# Patient Record
Sex: Female | Born: 1955 | Race: White | Hispanic: No | Marital: Married | Smoking: Never smoker
Health system: Southern US, Community
[De-identification: ages and names within clinical notes are randomized; demographics above are authoritative.]

## PROBLEM LIST (undated history)

## (undated) DIAGNOSIS — E119 Type 2 diabetes mellitus without complications: Secondary | ICD-10-CM

## (undated) HISTORY — DX: Type 2 diabetes mellitus without complications: E11.9

---

## 2020-10-28 ENCOUNTER — Inpatient Hospital Stay
Admission: EM | Admit: 2020-10-28 | Discharge: 2020-11-02 | DRG: 853 | Disposition: A | Discharge status: Home Health | Payer: Medicare Other | Attending: Family Medicine | Admitting: Family Medicine

## 2020-10-28 ENCOUNTER — Encounter: Payer: Self-pay | Admitting: Emergency Medicine

## 2020-10-28 ENCOUNTER — Emergency Department: Payer: Medicare Other

## 2020-10-28 ENCOUNTER — Other Ambulatory Visit: Payer: Self-pay

## 2020-10-28 DIAGNOSIS — E43 Unspecified severe protein-calorie malnutrition: Secondary | ICD-10-CM | POA: Diagnosis not present

## 2020-10-28 DIAGNOSIS — Z833 Family history of diabetes mellitus: Secondary | ICD-10-CM

## 2020-10-28 DIAGNOSIS — R0602 Shortness of breath: Secondary | ICD-10-CM | POA: Diagnosis not present

## 2020-10-28 DIAGNOSIS — R1110 Vomiting, unspecified: Secondary | ICD-10-CM | POA: Diagnosis not present

## 2020-10-28 DIAGNOSIS — N179 Acute kidney failure, unspecified: Secondary | ICD-10-CM | POA: Diagnosis not present

## 2020-10-28 DIAGNOSIS — C541 Malignant neoplasm of endometrium: Secondary | ICD-10-CM | POA: Diagnosis not present

## 2020-10-28 DIAGNOSIS — N950 Postmenopausal bleeding: Secondary | ICD-10-CM | POA: Diagnosis not present

## 2020-10-28 DIAGNOSIS — C539 Malignant neoplasm of cervix uteri, unspecified: Secondary | ICD-10-CM | POA: Diagnosis not present

## 2020-10-28 DIAGNOSIS — N390 Urinary tract infection, site not specified: Secondary | ICD-10-CM | POA: Diagnosis not present

## 2020-10-28 DIAGNOSIS — A419 Sepsis, unspecified organism: Secondary | ICD-10-CM

## 2020-10-28 DIAGNOSIS — Z9049 Acquired absence of other specified parts of digestive tract: Secondary | ICD-10-CM | POA: Diagnosis not present

## 2020-10-28 DIAGNOSIS — N939 Abnormal uterine and vaginal bleeding, unspecified: Secondary | ICD-10-CM

## 2020-10-28 DIAGNOSIS — D72829 Elevated white blood cell count, unspecified: Secondary | ICD-10-CM | POA: Diagnosis not present

## 2020-10-28 DIAGNOSIS — R6520 Severe sepsis without septic shock: Secondary | ICD-10-CM | POA: Diagnosis not present

## 2020-10-28 DIAGNOSIS — Z20822 Contact with and (suspected) exposure to covid-19: Secondary | ICD-10-CM | POA: Diagnosis present

## 2020-10-28 DIAGNOSIS — B955 Unspecified streptococcus as the cause of diseases classified elsewhere: Secondary | ICD-10-CM | POA: Diagnosis not present

## 2020-10-28 DIAGNOSIS — R000 Tachycardia, unspecified: Secondary | ICD-10-CM | POA: Diagnosis not present

## 2020-10-28 DIAGNOSIS — Z800 Family history of malignant neoplasm of digestive organs: Secondary | ICD-10-CM | POA: Diagnosis not present

## 2020-10-28 DIAGNOSIS — R17 Unspecified jaundice: Secondary | ICD-10-CM | POA: Diagnosis not present

## 2020-10-28 DIAGNOSIS — Z681 Body mass index (BMI) 19 or less, adult: Secondary | ICD-10-CM

## 2020-10-28 DIAGNOSIS — C55 Malignant neoplasm of uterus, part unspecified: Secondary | ICD-10-CM | POA: Diagnosis not present

## 2020-10-28 DIAGNOSIS — A409 Streptococcal sepsis, unspecified: Principal | ICD-10-CM | POA: Diagnosis present

## 2020-10-28 DIAGNOSIS — E1110 Type 2 diabetes mellitus with ketoacidosis without coma: Secondary | ICD-10-CM

## 2020-10-28 DIAGNOSIS — E1165 Type 2 diabetes mellitus with hyperglycemia: Secondary | ICD-10-CM | POA: Diagnosis not present

## 2020-10-28 DIAGNOSIS — E860 Dehydration: Secondary | ICD-10-CM | POA: Diagnosis not present

## 2020-10-28 DIAGNOSIS — R531 Weakness: Secondary | ICD-10-CM | POA: Diagnosis not present

## 2020-10-28 DIAGNOSIS — R7881 Bacteremia: Secondary | ICD-10-CM | POA: Diagnosis not present

## 2020-10-28 DIAGNOSIS — N858 Other specified noninflammatory disorders of uterus: Secondary | ICD-10-CM | POA: Diagnosis not present

## 2020-10-28 DIAGNOSIS — N938 Other specified abnormal uterine and vaginal bleeding: Secondary | ICD-10-CM | POA: Diagnosis not present

## 2020-10-28 DIAGNOSIS — N852 Hypertrophy of uterus: Secondary | ICD-10-CM | POA: Diagnosis not present

## 2020-10-28 LAB — BLOOD GAS, VENOUS
Acid-base deficit: 11.8 mmol/L — ABNORMAL HIGH (ref 0.0–2.0)
Bicarbonate: 13.8 mmol/L — ABNORMAL LOW (ref 20.0–28.0)
O2 Saturation: 57.4 %
Patient temperature: 37
pCO2, Ven: 30 mmHg — ABNORMAL LOW (ref 44.0–60.0)
pH, Ven: 7.27 (ref 7.250–7.430)
pO2, Ven: 35 mmHg (ref 32.0–45.0)

## 2020-10-28 LAB — URINALYSIS, COMPLETE (UACMP) WITH MICROSCOPIC
Bilirubin Urine: NEGATIVE
Glucose, UA: >=500 mg/dL — AB
Ketones, ur: 80 mg/dL — AB
Nitrite: NEGATIVE
Protein, ur: 100 mg/dL — AB
RBC / HPF: >50 RBC/hpf — ABNORMAL HIGH (ref 0–5)
Specific Gravity, Urine: 1.021 (ref 1.005–1.030)
pH: 5 (ref 5.0–8.0)

## 2020-10-28 LAB — CBC WITH DIFFERENTIAL/PLATELET
Abs Immature Granulocytes: 1.15 10*3/uL — ABNORMAL HIGH (ref 0.00–0.07)
Basophils Absolute: 0 10*3/uL (ref 0.0–0.1)
Basophils Relative: 0 %
Eosinophils Absolute: 0 10*3/uL (ref 0.0–0.5)
Eosinophils Relative: 0 %
HCT: 40.6 % (ref 36.0–46.0)
Hemoglobin: 12.6 g/dL (ref 12.0–15.0)
Immature Granulocytes: 3 %
Lymphocytes Relative: 2 %
Lymphs Abs: 0.9 10*3/uL (ref 0.7–4.0)
MCH: 25.1 pg — ABNORMAL LOW (ref 26.0–34.0)
MCHC: 31 g/dL (ref 30.0–36.0)
MCV: 81 fL (ref 80.0–100.0)
Monocytes Absolute: 1 10*3/uL (ref 0.1–1.0)
Monocytes Relative: 2 %
Neutro Abs: 36.7 10*3/uL — ABNORMAL HIGH (ref 1.7–7.7)
Neutrophils Relative %: 93 %
Platelets: 544 10*3/uL — ABNORMAL HIGH (ref 150–400)
RBC: 5.01 MIL/uL (ref 3.87–5.11)
RDW: 15 % (ref 11.5–15.5)
Smear Review: NORMAL
WBC: 39.8 10*3/uL — ABNORMAL HIGH (ref 4.0–10.5)
nRBC: 0 % (ref 0.0–0.2)

## 2020-10-28 LAB — BETA-HYDROXYBUTYRIC ACID: Beta-Hydroxybutyric Acid: >8 mmol/L — ABNORMAL HIGH (ref 0.05–0.27)

## 2020-10-28 LAB — CBG MONITORING, ED
Glucose-Capillary: 592 mg/dL (ref 70–99)
Glucose-Capillary: 522 mg/dL (ref 70–99)
Glucose-Capillary: 441 mg/dL — ABNORMAL HIGH (ref 70–99)
Glucose-Capillary: 452 mg/dL — ABNORMAL HIGH (ref 70–99)
Glucose-Capillary: 338 mg/dL — ABNORMAL HIGH (ref 70–99)

## 2020-10-28 LAB — COMPREHENSIVE METABOLIC PANEL
ALT: 14 U/L (ref 0–44)
AST: 16 U/L (ref 15–41)
Albumin: 2.7 g/dL — ABNORMAL LOW (ref 3.5–5.0)
Alkaline Phosphatase: 217 U/L — ABNORMAL HIGH (ref 38–126)
Anion gap: 38 — ABNORMAL HIGH (ref 5–15)
BUN: 28 mg/dL — ABNORMAL HIGH (ref 8–23)
CO2: 13 mmol/L — ABNORMAL LOW (ref 22–32)
Calcium: 9.7 mg/dL (ref 8.9–10.3)
Chloride: 87 mmol/L — ABNORMAL LOW (ref 98–111)
Creatinine, Ser: 1.11 mg/dL — ABNORMAL HIGH (ref 0.44–1.00)
GFR, Estimated: 55 mL/min — ABNORMAL LOW (ref >60)
Glucose, Bld: 624 mg/dL (ref 70–99)
Potassium: 3.6 mmol/L (ref 3.5–5.1)
Sodium: 138 mmol/L (ref 135–145)
Total Bilirubin: 3.2 mg/dL — ABNORMAL HIGH (ref 0.3–1.2)
Total Protein: 6.9 g/dL (ref 6.5–8.1)

## 2020-10-28 LAB — LACTIC ACID, PLASMA
Lactic Acid, Venous: 2.2 mmol/L (ref 0.5–1.9)
Lactic Acid, Venous: 2.3 mmol/L (ref 0.5–1.9)

## 2020-10-28 LAB — TROPONIN I (HIGH SENSITIVITY)
Troponin I (High Sensitivity): 50 ng/L — ABNORMAL HIGH (ref <18)
Troponin I (High Sensitivity): 19 ng/L — ABNORMAL HIGH (ref <18)

## 2020-10-28 LAB — SARS CORONAVIRUS 2 BY RT PCR (HOSPITAL ORDER, PERFORMED IN ~~LOC~~ HOSPITAL LAB): SARS Coronavirus 2: NEGATIVE

## 2020-10-28 IMAGING — CT CT ABD-PELV W/ CM
2 of 5 series · 15 of 46 positions shown, 17 images · IV contrast (APPLIED)
Comparison: None.

CLINICAL DATA: Nausea and vomiting with weight loss for several
months

EXAM:
CT ABDOMEN AND PELVIS WITH CONTRAST
TECHNIQUE: Multidetector CT imaging of the abdomen and pelvis was performed
using the standard protocol following bolus administration of
intravenous contrast.
CONTRAST:  75mL OMNIPAQUE IOHEXOL 300 MG/ML  SOLN

[Series 2: routine abd/pel with · axial · 0.75mm/px · z∈[-1480,-1060]mm · 12 of 96 slices shown, 14 images]
[im 6/96  soft-tissue]
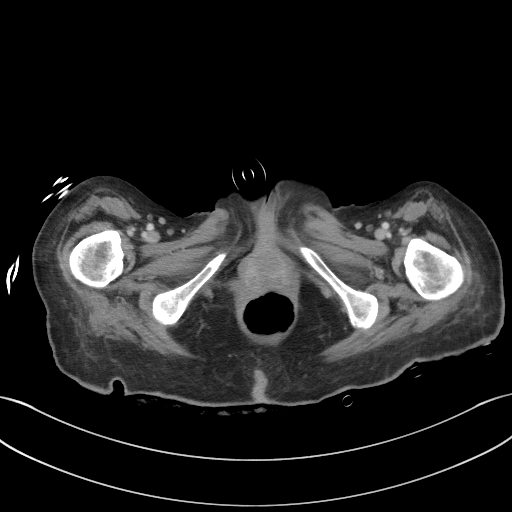
[im 6/96  bone]
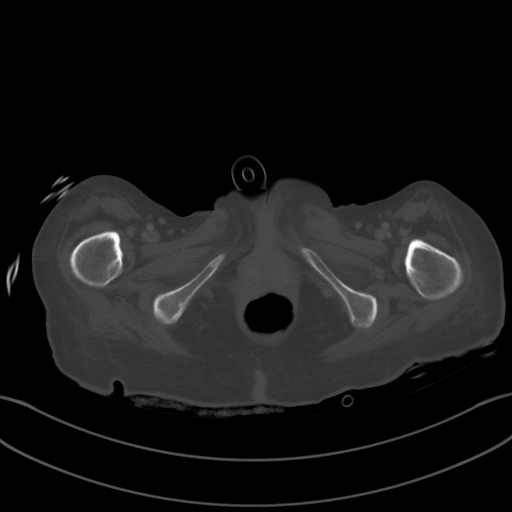
[im 16/96  soft-tissue]
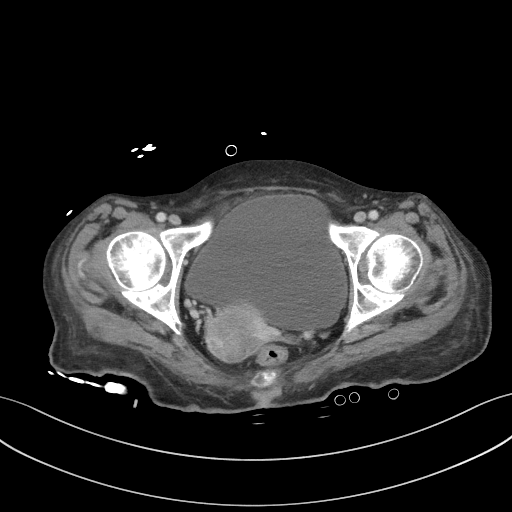
[im 22/96  soft-tissue]
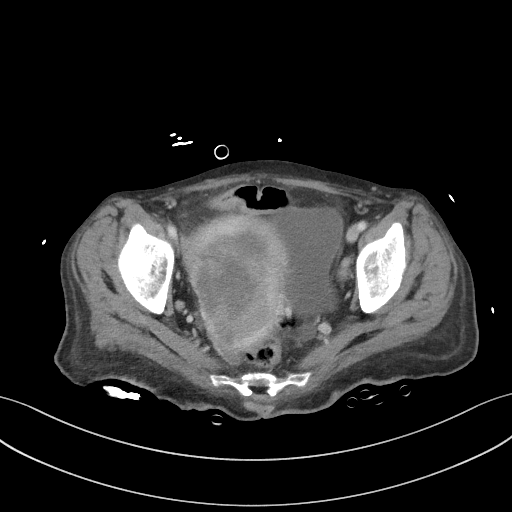
[im 27/96  soft-tissue]
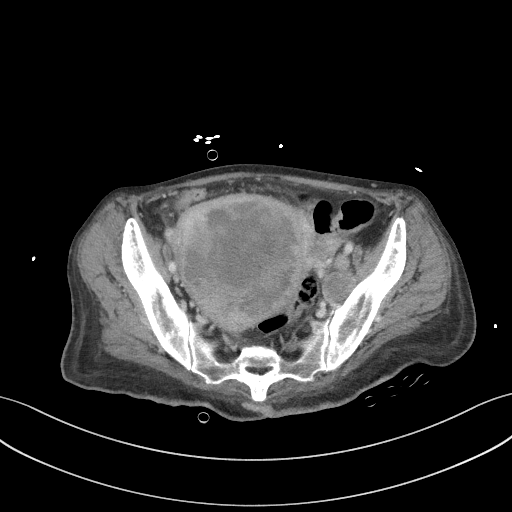
[im 37/96  soft-tissue]
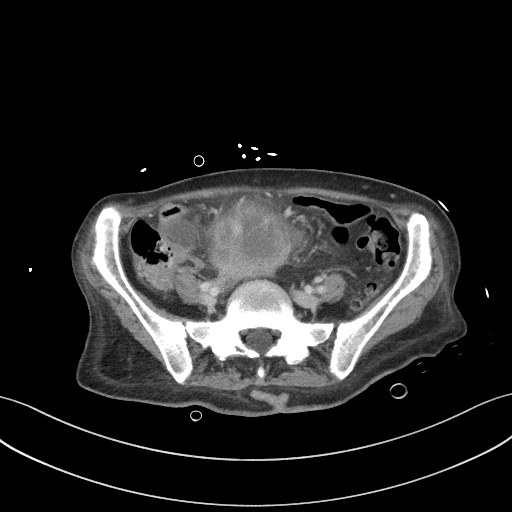
[im 43/96  soft-tissue]
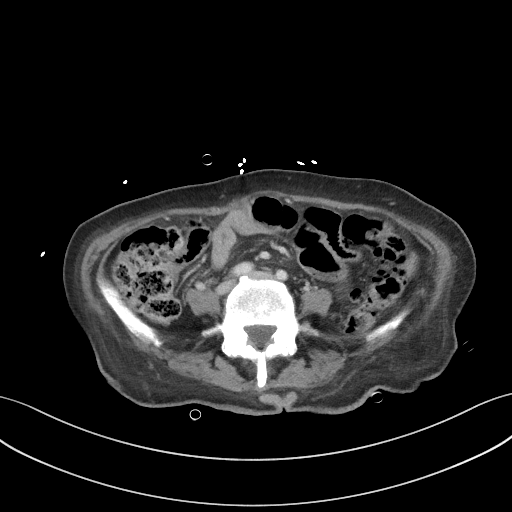
[im 53/96  soft-tissue]
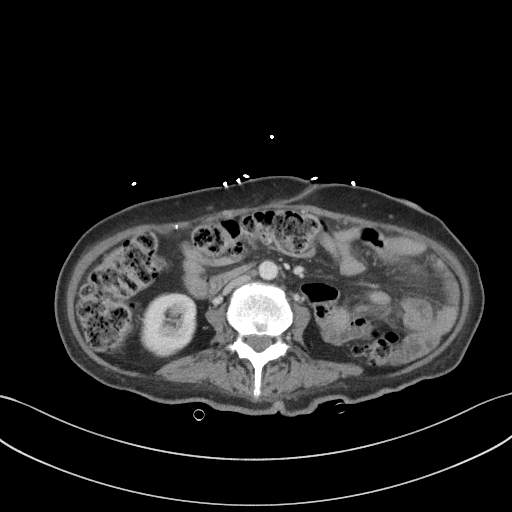
[im 59/96  soft-tissue]
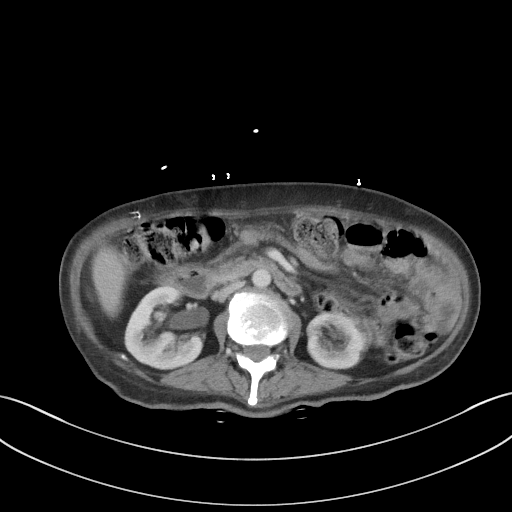
[im 69/96  soft-tissue]
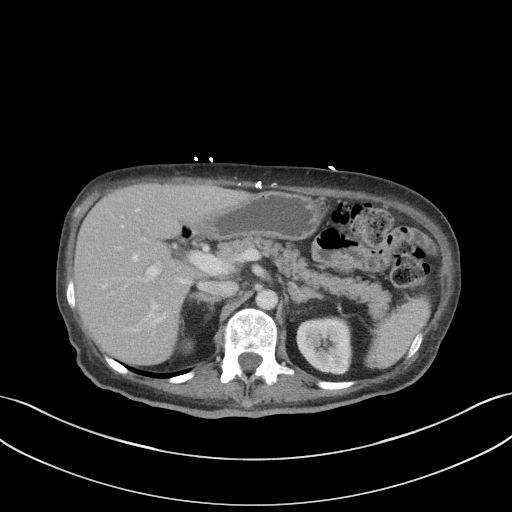
[im 69/96  bone]
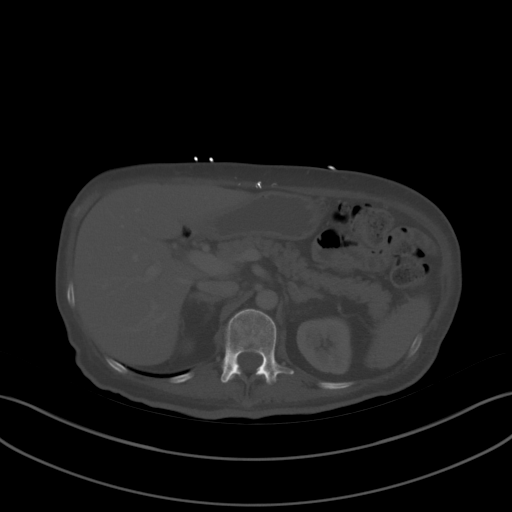
[im 74/96  soft-tissue]
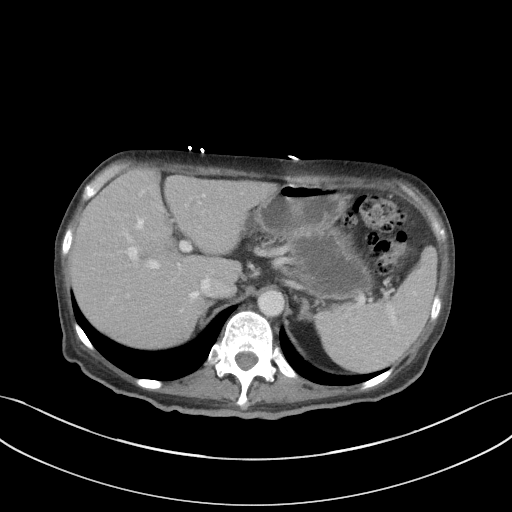
[im 80/96  soft-tissue]
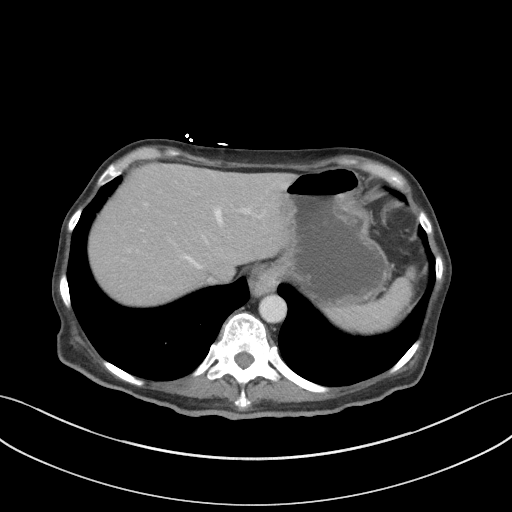
[im 90/96  soft-tissue]
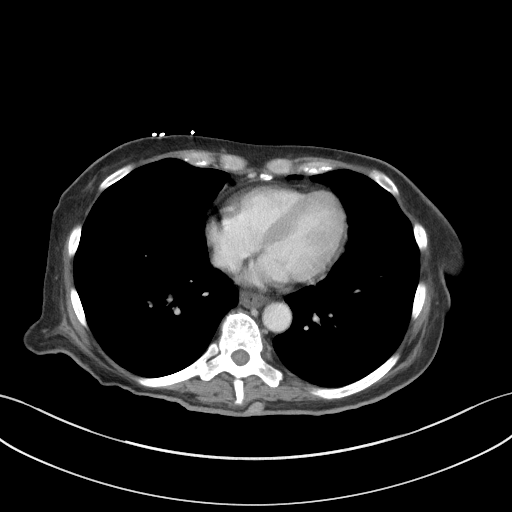

[Series 5: coronal st · coronal · 0.76mm/px · 3 of 66 slices shown]
[im 22/66  soft-tissue]
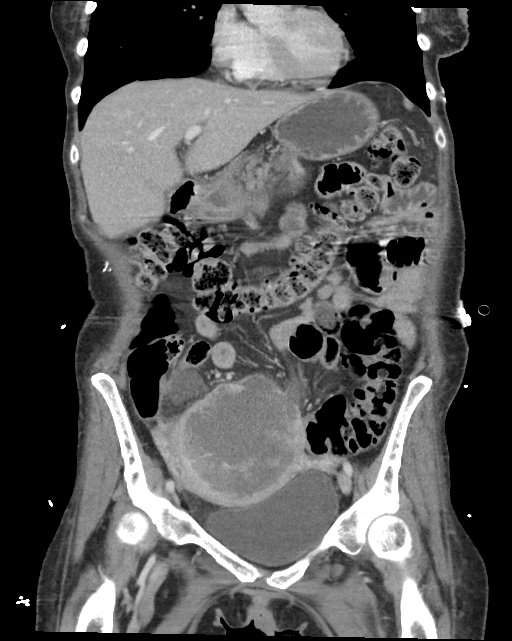
[im 29/66  soft-tissue]
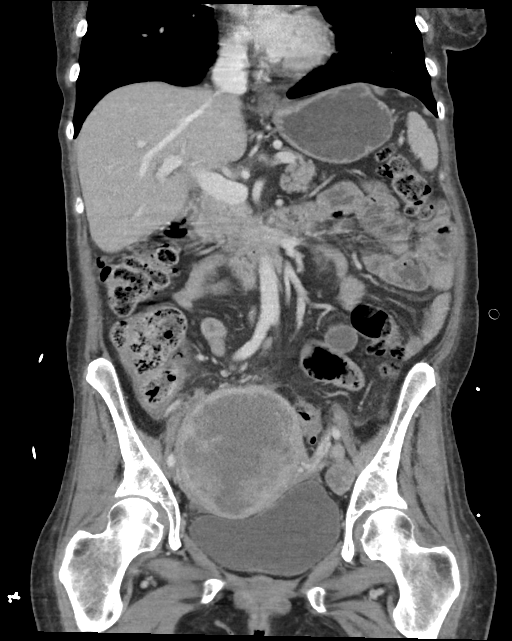
[im 37/66  soft-tissue]
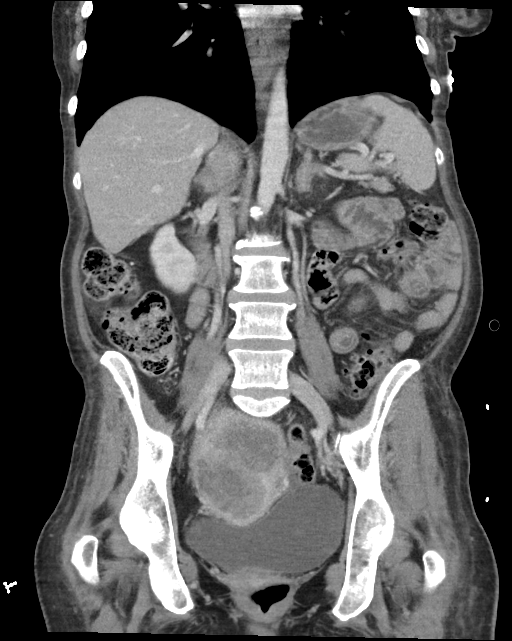

[15 of 46 positions shown; findings below may reference images not displayed]

FINDINGS: Lower chest: No acute abnormality.

Hepatobiliary: Liver is well visualized and within normal limits.
Gallbladder is not well seen and may have been surgically removed.
Clinical correlation is recommended.

Pancreas: Unremarkable. No pancreatic ductal dilatation or
surrounding inflammatory changes.

Spleen: Normal in size without focal abnormality.

Adrenals/Urinary Tract: Adrenal glands are within normal limits.
Kidneys demonstrate a normal enhancement pattern bilaterally.
Fullness of the right renal collecting system is noted secondary to
extrinsic compression by the prominent uterus. Bladder is somewhat
distorted secondary to the enlarged uterus but is otherwise well
distended.

Stomach/Bowel: Mild diverticular change of the colon is noted
without evidence of diverticulitis. The appendix is within normal
limits. No small bowel abnormality is seen. Small sliding-type
hiatal hernia is noted.

Vascular/Lymphatic: Abdominal aorta shows no aneurysmal dilatation.
Prominent centrally necrotic left iliac lymph node is noted
measuring 2.2 cm in short axis. No other sizable adenopathy is
noted.

Reproductive: The uterus is significantly enlarged measuring 8.7 by
9.9 cm in greatest AP and transverse dimensions respectively. It is
elongated and diffusely decreased in attenuation with peripheral
enhancement highly suggestive of endometrial neoplasm. These changes
extend into the cervical canal and may extend into the vaginal vault
on the sagittal images. No definitive adnexal mass is noted.

Other: No free fluid is seen. No definitive endometrial deposits are
noted at this time. Postsurgical changes in the anterior abdominal
wall are seen.

Musculoskeletal: No acute or significant osseous findings.
IMPRESSION: Diffuse enlargement of the uterus with peripheral enhancement and
central decreased attenuation/necrosis consistent with endometrial
neoplasm till proven otherwise. This extends into the cervical canal
and likely into the vaginal vault. Associated left iliac adenopathy
is noted of a similar appearance with central necrosis. Given its
diffuse distribution throughout the entire uterus, this is not felt
to represent fibroid change. Ultrasound is not likely to be helpful
as a diagnostic tool. Direct visualization is recommended. Tissue
sampling is also recommended. Surgical consultation is also
recommended.

Diverticulosis without diverticulitis

Prominence of the right renal collecting system and proximal right
ureter secondary to extrinsic compression by the enlarged uterus. No
true obstructing lesion is noted within the ureter.

## 2020-10-28 MED ORDER — LACTATED RINGERS IV SOLN: INTRAVENOUS | Status: DC

## 2020-10-28 MED ORDER — DEXTROSE IN LACTATED RINGERS 5 % IV SOLN: INTRAVENOUS | Status: DC

## 2020-10-28 MED ORDER — PHENOL 1.4 % MT LIQD
1.0000 | OROMUCOSAL | Status: DC | PRN
  Filled 2020-10-28 (×2): qty 177

## 2020-10-28 MED ORDER — SODIUM CHLORIDE 0.9 % IV SOLN
2.0000 g | Freq: Once | INTRAVENOUS | Status: AC
  Administered 2020-10-28: 2 g via INTRAVENOUS
  Filled 2020-10-28: qty 2

## 2020-10-28 MED ORDER — LORAZEPAM 2 MG/ML IJ SOLN
0.5000 mg | Freq: Once | INTRAMUSCULAR | Status: AC
  Administered 2020-10-28: 0.5 mg via INTRAVENOUS
  Filled 2020-10-28: qty 1

## 2020-10-28 MED ORDER — INSULIN REGULAR(HUMAN) IN NACL 100-0.9 UT/100ML-% IV SOLN
INTRAVENOUS | Status: DC
  Administered 2020-10-28: 8.5 [IU]/h via INTRAVENOUS
  Filled 2020-10-28: qty 100

## 2020-10-28 MED ORDER — SODIUM CHLORIDE 0.9 % IV BOLUS
500.0000 mL | Freq: Once | INTRAVENOUS | Status: AC
  Administered 2020-10-29: 500 mL via INTRAVENOUS

## 2020-10-28 MED ORDER — LACTATED RINGERS IV BOLUS
1000.0000 mL | Freq: Once | INTRAVENOUS | Status: AC
  Administered 2020-10-28: 1000 mL via INTRAVENOUS

## 2020-10-28 MED ORDER — METRONIDAZOLE IN NACL 5-0.79 MG/ML-% IV SOLN
500.0000 mg | Freq: Once | INTRAVENOUS | Status: AC
  Administered 2020-10-28: 500 mg via INTRAVENOUS
  Filled 2020-10-28: qty 100

## 2020-10-28 MED ORDER — POTASSIUM CHLORIDE 10 MEQ/100ML IV SOLN
10.0000 meq | INTRAVENOUS | Status: AC
  Administered 2020-10-28 (×2): 10 meq via INTRAVENOUS
  Filled 2020-10-28 (×2): qty 100

## 2020-10-28 MED ORDER — DEXTROSE 50 % IV SOLN: 0.0000 mL | INTRAVENOUS | Status: DC | PRN

## 2020-10-28 MED ORDER — IOHEXOL 300 MG/ML  SOLN
75.0000 mL | Freq: Once | INTRAMUSCULAR | Status: AC | PRN
  Administered 2020-10-28: 75 mL via INTRAVENOUS

## 2020-10-28 MED ORDER — ONDANSETRON HCL 4 MG/2ML IJ SOLN
4.0000 mg | Freq: Once | INTRAMUSCULAR | Status: AC
  Administered 2020-10-28: 4 mg via INTRAVENOUS
  Filled 2020-10-28: qty 2

## 2020-10-28 NOTE — ED Notes (Addendum)
This RN attempted to obtain second set of blood cultures x 2. RN Lorriane Shire attempted x 2. Unsuccessful. ABX started. Lab called to collect 2nd set

## 2020-10-28 NOTE — ED Provider Notes (Signed)
[facility] Emergency Department Provider Note  ____________________________________________   Event Date/Time   First MD Initiated Contact with Patient 10/28/20 1748     (approximate)  I have reviewed the triage vital signs and the nursing notes.   HISTORY  Chief Complaint Hyperglycemia    HPI Mindy Olson is a 65 y.o. female  Here with nausea, vomiting, polyuria, polydipsia. Pt reports that for the past 3-4 weeks, she's had ongoing and worsening nausea, vomiting, and indigestion. She's been trying to drink juices to stay hydrated and has subsequently been having more indigestion/heart burn.  She states that over the last week, she has had extreme thirst, despite drinking significant amounts of water, as well as fatigue and some occasional shortness of breath.  She has had intermittent abdominal cramping though this has not been constant.  She has had associated increased urination although this is somewhat decreased now.  She states she feels intermittent indigestion, but this is also not constant.  No other complaints.  No fevers or chills.  No other medications.        History reviewed. No pertinent past medical history.  Patient Active Problem List   Diagnosis Date Noted  . Sepsis secondary to UTI ([place]) 10/29/2020  . AKI (acute kidney injury) ([place]) 10/29/2020  . Abnormal uterine and vaginal bleeding, unspecified 10/29/2020  . Elevated bilirubin 10/29/2020  . DKA (diabetic ketoacidosis) ([place]) 10/28/2020    History reviewed. No pertinent surgical history.  Prior to Admission medications   Not on File    Allergies Patient has no known allergies.  No family history on file.  Social History Social History   Tobacco Use  . Smoking status: Never Smoker  . Smokeless tobacco: Never Used  Substance Use Topics  . Alcohol use: Not Currently  . Drug use: Not Currently    Review of Systems  Review of Systems  Constitutional: Positive  for fatigue. Negative for chills and fever.  HENT: Negative for congestion and sore throat.   Eyes: Negative for visual disturbance.  Respiratory: Negative for cough and shortness of breath.   Cardiovascular: Negative for chest pain.  Gastrointestinal: Negative for abdominal pain, diarrhea, nausea and vomiting.  Endocrine: Positive for polydipsia, polyphagia and polyuria.  Genitourinary: Negative for flank pain.  Musculoskeletal: Negative for back pain and neck pain.  Skin: Negative for rash and wound.  Allergic/Immunologic: Negative for immunocompromised state.  Neurological: Positive for weakness. Negative for numbness.  Hematological: Does not bruise/bleed easily.  All other systems reviewed and are negative.    ____________________________________________  PHYSICAL EXAM:      VITAL SIGNS: ED Triage Vitals  Enc Vitals Group     BP 10/28/20 1732 97/64     Pulse Rate 10/28/20 1732 (!) 144     Resp 10/28/20 1732 20     Temp 10/28/20 1732 97.7 F (36.5 C)     Temp Source 10/28/20 1732 Oral     SpO2 10/28/20 1732 100 %     Weight 10/28/20 1733 112 lb (50.8 kg)     Height 10/28/20 1733 5\' 4"  (1.626 m)     Head Circumference --      Peak Flow --      Pain Score 10/28/20 1733 0     Pain Loc --      Pain Edu? --      Excl. in [place]? --      Physical Exam Vitals and nursing note reviewed.  Constitutional:  General: She is not in acute distress.    Appearance: She is well-developed and well-nourished.  HENT:     Head: Normocephalic and atraumatic.     Mouth/Throat:     Mouth: Mucous membranes are dry.  Eyes:     Conjunctiva/sclera: Conjunctivae normal.  Cardiovascular:     Rate and Rhythm: Regular rhythm. Tachycardia present.     Heart sounds: Normal heart sounds.  Pulmonary:     Effort: Pulmonary effort is normal. No respiratory distress.     Breath sounds: No wheezing.  Abdominal:     General: Abdomen is flat. There is no distension.     Tenderness: There is no  abdominal tenderness.  Musculoskeletal:        General: No edema.     Cervical back: Neck supple.  Skin:    General: Skin is warm.     Capillary Refill: Capillary refill takes less than 2 seconds.     Findings: No rash.  Neurological:     Mental Status: She is alert and oriented to person, place, and time.     Motor: No abnormal muscle tone.       ____________________________________________   LABS (all labs ordered are listed, but only abnormal results are displayed)  Labs Reviewed  CBC WITH DIFFERENTIAL/PLATELET - Abnormal; Notable for the following components:      Result Value   WBC 39.8 (*)    MCH 25.1 (*)    Platelets 544 (*)    Neutro Abs 36.7 (*)    Abs Immature Granulocytes 1.15 (*)    All other components within normal limits  COMPREHENSIVE METABOLIC PANEL - Abnormal; Notable for the following components:   Chloride 87 (*)    CO2 13 (*)    Glucose, Bld 624 (*)    BUN 28 (*)    Creatinine, Ser 1.11 (*)    Albumin 2.7 (*)    Alkaline Phosphatase 217 (*)    Total Bilirubin 3.2 (*)    GFR, Estimated 55 (*)    Anion gap 38 (*)    All other components within normal limits  BLOOD GAS, VENOUS - Abnormal; Notable for the following components:   pCO2, Ven 30 (*)    Bicarbonate 13.8 (*)    Acid-base deficit 11.8 (*)    All other components within normal limits  BETA-HYDROXYBUTYRIC ACID - Abnormal; Notable for the following components:   Beta-Hydroxybutyric Acid >8.00 (*)    All other components within normal limits  LACTIC ACID, PLASMA - Abnormal; Notable for the following components:   Lactic Acid, Venous 2.2 (*)    All other components within normal limits  LACTIC ACID, PLASMA - Abnormal; Notable for the following components:   Lactic Acid, Venous 2.3 (*)    All other components within normal limits  URINALYSIS, COMPLETE (UACMP) WITH MICROSCOPIC - Abnormal; Notable for the following components:   Color, Urine AMBER (*)    APPearance CLOUDY (*)    Glucose, UA  >=500 (*)    Hgb urine dipstick LARGE (*)    Ketones, ur 80 (*)    Protein, ur 100 (*)    Leukocytes,Ua MODERATE (*)    RBC / HPF >50 (*)    Bacteria, UA RARE (*)    All other components within normal limits  CBG MONITORING, ED - Abnormal; Notable for the following components:   Glucose-Capillary 592 (*)    All other components within normal limits  CBG MONITORING, ED - Abnormal; Notable for the following  components:   Glucose-Capillary 522 (*)    All other components within normal limits  CBG MONITORING, ED - Abnormal; Notable for the following components:   Glucose-Capillary 452 (*)    All other components within normal limits  CBG MONITORING, ED - Abnormal; Notable for the following components:   Glucose-Capillary 441 (*)    All other components within normal limits  CBG MONITORING, ED - Abnormal; Notable for the following components:   Glucose-Capillary 338 (*)    All other components within normal limits  CBG MONITORING, ED - Abnormal; Notable for the following components:   Glucose-Capillary 271 (*)    All other components within normal limits  CBG MONITORING, ED - Abnormal; Notable for the following components:   Glucose-Capillary 211 (*)    All other components within normal limits  TROPONIN I (HIGH SENSITIVITY) - Abnormal; Notable for the following components:   Troponin I (High Sensitivity) 50 (*)    All other components within normal limits  TROPONIN I (HIGH SENSITIVITY) - Abnormal; Notable for the following components:   Troponin I (High Sensitivity) 19 (*)    All other components within normal limits  SARS CORONAVIRUS 2 BY RT PCR (HOSPITAL ORDER, [place] [facility])  CULTURE, BLOOD (SINGLE)  CULTURE, BLOOD (SINGLE)  BASIC METABOLIC PANEL  BASIC METABOLIC PANEL  HIV ANTIBODY (ROUTINE TESTING W REFLEX)  BASIC METABOLIC PANEL  BASIC METABOLIC PANEL  BASIC METABOLIC PANEL  BASIC METABOLIC PANEL  BETA-HYDROXYBUTYRIC ACID  BETA-HYDROXYBUTYRIC  ACID  HEMOGLOBIN Q0H  BASIC METABOLIC PANEL  BASIC METABOLIC PANEL  BETA-HYDROXYBUTYRIC ACID  LACTIC ACID, PLASMA  LACTIC ACID, PLASMA    ____________________________________________  EKG: Sinus tachycardia, ventricular rate 149.  PR 120, QRS 72, QTc 532.  No acute ST elevations or depressions.  No KG evidence of acute ischemia or infarct. ________________________________________  RADIOLOGY All imaging, including plain films, CT scans, and ultrasounds, independently reviewed by me, and interpretations confirmed via formal radiology reads.  ED MD interpretation:   CT A/P: Large uterine mass with possible LN involvement  Official radiology report(s): CT ABDOMEN PELVIS W CONTRAST  Result Date: 10/28/2020 CLINICAL DATA:  Nausea and vomiting with weight loss for several months EXAM: CT ABDOMEN AND PELVIS WITH CONTRAST TECHNIQUE: Multidetector CT imaging of the abdomen and pelvis was performed using the standard protocol following bolus administration of intravenous contrast. CONTRAST:  24mL OMNIPAQUE IOHEXOL 300 MG/ML  SOLN COMPARISON:  None. FINDINGS: Lower chest: No acute abnormality. Hepatobiliary: Liver is well visualized and within normal limits. Gallbladder is not well seen and may have been surgically removed. Clinical correlation is recommended. Pancreas: Unremarkable. No pancreatic ductal dilatation or surrounding inflammatory changes. Spleen: Normal in size without focal abnormality. Adrenals/Urinary Tract: Adrenal glands are within normal limits. Kidneys demonstrate a normal enhancement pattern bilaterally. Fullness of the right renal collecting system is noted secondary to extrinsic compression by the prominent uterus. Bladder is somewhat distorted secondary to the enlarged uterus but is otherwise well distended. Stomach/Bowel: Mild diverticular change of the colon is noted without evidence of diverticulitis. The appendix is within normal limits. No small bowel abnormality is seen.  Small sliding-type hiatal hernia is noted. Vascular/Lymphatic: Abdominal aorta shows no aneurysmal dilatation. Prominent centrally necrotic left iliac lymph node is noted measuring 2.2 cm in short axis. No other sizable adenopathy is noted. Reproductive: The uterus is significantly enlarged measuring 8.7 by 9.9 cm in greatest AP and transverse dimensions respectively. It is elongated and diffusely decreased in attenuation with peripheral enhancement  highly suggestive of endometrial neoplasm. These changes extend into the cervical canal and may extend into the vaginal vault on the sagittal images. No definitive adnexal mass is noted. Other: No free fluid is seen. No definitive endometrial deposits are noted at this time. Postsurgical changes in the anterior abdominal wall are seen. Musculoskeletal: No acute or significant osseous findings. 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 This extends into the cervical canal and likely into the vaginal vault. Associated left iliac adenopathy is noted of a similar appearance with central necrosis. Given its diffuse distribution throughout the entire uterus, this is not felt to represent fibroid change. Ultrasound is not likely to be helpful as a diagnostic tool. Direct visualization is recommended. Tissue sampling is also recommended. Surgical consultation is also recommended. Diverticulosis without diverticulitis Prominence of the right renal collecting system and proximal right ureter secondary to extrinsic compression by the enlarged uterus. No true obstructing lesion is noted within the ureter. Electronically Signed   By: Inez Catalina M.D.   On: 10/28/2020 21:56   DG Chest Portable 1 View  Result Date: 10/28/2020 CLINICAL DATA:  Shortness of breath EXAM: PORTABLE CHEST 1 VIEW COMPARISON:  None. FINDINGS: The heart size and mediastinal  contours are within normal limits. Both lungs are clear. The visualized skeletal structures are unremarkable. IMPRESSION: No active disease. Electronically Signed   By: Inez Catalina M.D.   On: 10/28/2020 19:40    ____________________________________________  PROCEDURES   Procedure(s) performed (including Critical Care):  .Critical Care Performed by: Duffy Bruce, MD Authorized by: Duffy Bruce, MD   Critical care provider statement:    Critical care time (minutes):  35   Critical care time was exclusive of:  Separately billable procedures and treating other patients and teaching time   Critical care was necessary to treat or prevent imminent or life-threatening deterioration of the following conditions:  Cardiac failure, circulatory failure, dehydration, metabolic crisis and sepsis   Critical care was time spent personally by me on the following activities:  Development of treatment plan with patient or surrogate, discussions with consultants, evaluation of patient's response to treatment, examination of patient, obtaining history from patient or surrogate, ordering and performing treatments and interventions, ordering and review of laboratory studies, ordering and review of radiographic studies, pulse oximetry, re-evaluation of patient's condition and review of old charts   I assumed direction of critical care for this patient from another provider in my specialty: no   .1-3 Lead EKG Interpretation Performed by: Duffy Bruce, MD Authorized by: Duffy Bruce, MD     Interpretation: abnormal     ECG rate:  100-130   ECG rate assessment: tachycardic     Rhythm: sinus tachycardia     Ectopy: none     Conduction: normal   Comments:     Indication: dka    ____________________________________________  INITIAL IMPRESSION / MDM / [place] / ED COURSE  As part of my medical decision making, I reviewed the following data within the [place] notes reviewed and incorporated, Old chart reviewed, Notes from prior ED visits, and [place] Controlled Substance Database       *Mindy Olson was evaluated in Emergency Department on 10/29/2020 for the symptoms described in the history of present illness. She was evaluated in the context of the global COVID-19 pandemic, which necessitated consideration that the patient might be at risk for infection with  the SARS-CoV-2 virus that causes COVID-19. Institutional protocols and algorithms that pertain to the evaluation of patients at risk for COVID-19 are in a state of rapid change based on information released by regulatory bodies including the CDC and federal and state organizations. These policies and algorithms were followed during the patient's care in the ED.  Some ED evaluations and interventions may be delayed as a result of limited staffing during the pandemic.*     Medical Decision Making: 65 year old female here with new onset diabetes and DKA.  Suspect this was triggered in the setting of infection.  Labs reviewed, revealed marked leukocytosis with shift, acidosis with bicarb of 13 and anion gap greater than 30, glucose 624.  pH 7.27.  Troponin mildly elevated likely related to demand in EKG shows no ST elevations and she has no chest pain.  Given her marked leukocytosis, tachycardia, and concern for intra-abdominal infection, she was empirically covered with antibiotics and will obtain a CT of the abdomen and pelvis.  Patient started on aggressive fluid resuscitation.  CT reviewed, concerning for endometrial carcinoma, with possible LN involvement. I discussed this with pt, daughter at bedside. No surgical emergency. UA reviewed and shows pyuria, hematuria, raising question of UTI as well.  Pt has been given fluids, insulin, broad-spectrum ABX. Admit to medicine.  ____________________________________________  FINAL CLINICAL IMPRESSION(S) / ED DIAGNOSES  Final diagnoses:   Diabetic ketoacidosis without coma associated with type 2 diabetes mellitus (HCC)  Dehydration  Leukocytosis, unspecified type     MEDICATIONS GIVEN DURING THIS VISIT:  Medications  phenol (CHLORASEPTIC) mouth spray 1 spray (has no administration in time range)  insulin regular, human (MYXREDLIN) 100 units/ 100 mL infusion (3.6 Units/hr Intravenous Rate/Dose Change 10/29/20 0055)  lactated ringers infusion ( Intravenous New Bag/Given 10/28/20 1955)  dextrose 5 % in lactated ringers infusion (has no administration in time range)  dextrose 50 % solution 0-50 mL (has no administration in time range)  cefTRIAXone (ROCEPHIN) 1 g in sodium chloride 0.9 % 100 mL IVPB (has no administration in time range)  ondansetron (ZOFRAN) injection 4 mg (has no administration in time range)  lactated ringers bolus 1,000 mL (0 mLs Intravenous Stopped 10/28/20 2005)  lactated ringers bolus 1,000 mL (0 mLs Intravenous Stopped 10/28/20 2005)  ondansetron (ZOFRAN) injection 4 mg (4 mg Intravenous Given 10/28/20 1912)  potassium chloride 10 mEq in 100 mL IVPB (0 mEq Intravenous Stopped 10/28/20 2145)  ceFEPIme (MAXIPIME) 2 g in sodium chloride 0.9 % 100 mL IVPB (0 g Intravenous Stopped 10/28/20 2102)  metroNIDAZOLE (FLAGYL) IVPB 500 mg (0 mg Intravenous Stopped 10/28/20 2207)  iohexol (OMNIPAQUE) 300 MG/ML solution 75 mL (75 mLs Intravenous Contrast Given 10/28/20 2119)  sodium chloride 0.9 % bolus 500 mL (500 mLs Intravenous New Bag/Given 10/29/20 0013)  LORazepam (ATIVAN) injection 0.5 mg (0.5 mg Intravenous Given 10/28/20 2351)     ED Discharge Orders    None       Note:  This document was prepared using Dragon voice recognition software and may include unintentional dictation errors.   Duffy Bruce, MD 10/29/20 [phone]

## 2020-10-28 NOTE — Consult Note (Signed)
PHARMACY -  BRIEF ANTIBIOTIC NOTE   Pharmacy has received consult(s) for cefepime from an ED provider.  The patient's profile has been reviewed for ht/wt/allergies/indication/available labs.    One time order(s) placed for cefepime 2 gram x 1   Further antibiotics/pharmacy consults should be ordered by admitting physician if indicated.                       Thank you, Dorothe Pea, PharmD, BCPS Clinical Pharmacist  10/28/2020  8:20 PM

## 2020-10-28 NOTE — ED Triage Notes (Signed)
Pt in via EMS from home with c/o  Pt has had weight loss over the past 6 months and now has difficulty keeping food down. Pt extremely weak, thirsty and dizzy FSBS 591, 124/79, HR 130. Pt has been given 500cc's of NS, IV #18 to left [facility]

## 2020-10-29 DIAGNOSIS — E1110 Type 2 diabetes mellitus with ketoacidosis without coma: Secondary | ICD-10-CM

## 2020-10-29 DIAGNOSIS — R17 Unspecified jaundice: Secondary | ICD-10-CM

## 2020-10-29 DIAGNOSIS — D72829 Elevated white blood cell count, unspecified: Secondary | ICD-10-CM

## 2020-10-29 DIAGNOSIS — N390 Urinary tract infection, site not specified: Secondary | ICD-10-CM

## 2020-10-29 DIAGNOSIS — N858 Other specified noninflammatory disorders of uterus: Secondary | ICD-10-CM

## 2020-10-29 DIAGNOSIS — N939 Abnormal uterine and vaginal bleeding, unspecified: Secondary | ICD-10-CM

## 2020-10-29 DIAGNOSIS — N179 Acute kidney failure, unspecified: Secondary | ICD-10-CM

## 2020-10-29 DIAGNOSIS — B955 Unspecified streptococcus as the cause of diseases classified elsewhere: Secondary | ICD-10-CM

## 2020-10-29 DIAGNOSIS — A419 Sepsis, unspecified organism: Secondary | ICD-10-CM

## 2020-10-29 DIAGNOSIS — R7881 Bacteremia: Secondary | ICD-10-CM

## 2020-10-29 LAB — CBG MONITORING, ED
Glucose-Capillary: 155 mg/dL — ABNORMAL HIGH (ref 70–99)
Glucose-Capillary: 165 mg/dL — ABNORMAL HIGH (ref 70–99)
Glucose-Capillary: 176 mg/dL — ABNORMAL HIGH (ref 70–99)
Glucose-Capillary: 186 mg/dL — ABNORMAL HIGH (ref 70–99)
Glucose-Capillary: 187 mg/dL — ABNORMAL HIGH (ref 70–99)
Glucose-Capillary: 188 mg/dL — ABNORMAL HIGH (ref 70–99)
Glucose-Capillary: 194 mg/dL — ABNORMAL HIGH (ref 70–99)
Glucose-Capillary: 197 mg/dL — ABNORMAL HIGH (ref 70–99)
Glucose-Capillary: 199 mg/dL — ABNORMAL HIGH (ref 70–99)
Glucose-Capillary: 200 mg/dL — ABNORMAL HIGH (ref 70–99)
Glucose-Capillary: 202 mg/dL — ABNORMAL HIGH (ref 70–99)
Glucose-Capillary: 204 mg/dL — ABNORMAL HIGH (ref 70–99)
Glucose-Capillary: 207 mg/dL — ABNORMAL HIGH (ref 70–99)
Glucose-Capillary: 211 mg/dL — ABNORMAL HIGH (ref 70–99)
Glucose-Capillary: 215 mg/dL — ABNORMAL HIGH (ref 70–99)
Glucose-Capillary: 217 mg/dL — ABNORMAL HIGH (ref 70–99)
Glucose-Capillary: 271 mg/dL — ABNORMAL HIGH (ref 70–99)

## 2020-10-29 LAB — BASIC METABOLIC PANEL
Anion gap: 13 (ref 5–15)
Anion gap: 9 (ref 5–15)
Anion gap: 24 — ABNORMAL HIGH (ref 5–15)
Anion gap: 14 (ref 5–15)
Anion gap: 11 (ref 5–15)
BUN: 22 mg/dL (ref 8–23)
BUN: 17 mg/dL (ref 8–23)
BUN: 24 mg/dL — ABNORMAL HIGH (ref 8–23)
BUN: 16 mg/dL (ref 8–23)
BUN: 12 mg/dL (ref 8–23)
CO2: 28 mmol/L (ref 22–32)
CO2: 29 mmol/L (ref 22–32)
CO2: 21 mmol/L — ABNORMAL LOW (ref 22–32)
CO2: 28 mmol/L (ref 22–32)
CO2: 28 mmol/L (ref 22–32)
Calcium: 8.9 mg/dL (ref 8.9–10.3)
Calcium: 8.7 mg/dL — ABNORMAL LOW (ref 8.9–10.3)
Calcium: 9.4 mg/dL (ref 8.9–10.3)
Calcium: 9.3 mg/dL (ref 8.9–10.3)
Calcium: 8.7 mg/dL — ABNORMAL LOW (ref 8.9–10.3)
Chloride: 101 mmol/L (ref 98–111)
Chloride: 104 mmol/L (ref 98–111)
Chloride: 98 mmol/L (ref 98–111)
Chloride: 103 mmol/L (ref 98–111)
Chloride: 105 mmol/L (ref 98–111)
Creatinine, Ser: 0.71 mg/dL (ref 0.44–1.00)
Creatinine, Ser: 0.44 mg/dL (ref 0.44–1.00)
Creatinine, Ser: 0.9 mg/dL (ref 0.44–1.00)
Creatinine, Ser: 0.47 mg/dL (ref 0.44–1.00)
Creatinine, Ser: 0.39 mg/dL — ABNORMAL LOW (ref 0.44–1.00)
GFR, Estimated: >60 mL/min (ref >60)
GFR, Estimated: >60 mL/min (ref >60)
GFR, Estimated: >60 mL/min (ref >60)
GFR, Estimated: >60 mL/min (ref >60)
GFR, Estimated: >60 mL/min (ref >60)
Glucose, Bld: 234 mg/dL — ABNORMAL HIGH (ref 70–99)
Glucose, Bld: 234 mg/dL — ABNORMAL HIGH (ref 70–99)
Glucose, Bld: 216 mg/dL — ABNORMAL HIGH (ref 70–99)
Glucose, Bld: 198 mg/dL — ABNORMAL HIGH (ref 70–99)
Glucose, Bld: 192 mg/dL — ABNORMAL HIGH (ref 70–99)
Potassium: 3.1 mmol/L — ABNORMAL LOW (ref 3.5–5.1)
Potassium: 2.9 mmol/L — ABNORMAL LOW (ref 3.5–5.1)
Potassium: 3.3 mmol/L — ABNORMAL LOW (ref 3.5–5.1)
Potassium: 3.1 mmol/L — ABNORMAL LOW (ref 3.5–5.1)
Potassium: 3.7 mmol/L (ref 3.5–5.1)
Sodium: 142 mmol/L (ref 135–145)
Sodium: 142 mmol/L (ref 135–145)
Sodium: 143 mmol/L (ref 135–145)
Sodium: 145 mmol/L (ref 135–145)
Sodium: 144 mmol/L (ref 135–145)

## 2020-10-29 LAB — BLOOD CULTURE ID PANEL (REFLEXED) - BCID2

## 2020-10-29 LAB — HEMOGLOBIN A1C
Hgb A1c MFr Bld: 14.8 % — ABNORMAL HIGH (ref 4.8–5.6)
Mean Plasma Glucose: 378.06 mg/dL

## 2020-10-29 LAB — HEMOGLOBIN AND HEMATOCRIT, BLOOD
HCT: 35.2 % — ABNORMAL LOW (ref 36.0–46.0)
Hemoglobin: 11.1 g/dL — ABNORMAL LOW (ref 12.0–15.0)

## 2020-10-29 LAB — BETA-HYDROXYBUTYRIC ACID
Beta-Hydroxybutyric Acid: 0.24 mmol/L (ref 0.05–0.27)
Beta-Hydroxybutyric Acid: 4.71 mmol/L — ABNORMAL HIGH (ref 0.05–0.27)

## 2020-10-29 LAB — LACTIC ACID, PLASMA
Lactic Acid, Venous: 2.1 mmol/L (ref 0.5–1.9)
Lactic Acid, Venous: 1.7 mmol/L (ref 0.5–1.9)

## 2020-10-29 LAB — HIV ANTIBODY (ROUTINE TESTING W REFLEX): HIV Screen 4th Generation wRfx: NONREACTIVE

## 2020-10-29 MED ORDER — MORPHINE SULFATE (PF) 2 MG/ML IV SOLN
1.0000 mg | Freq: Once | INTRAVENOUS | Status: AC
  Administered 2020-10-29: 1 mg via INTRAVENOUS
  Filled 2020-10-29: qty 1

## 2020-10-29 MED ORDER — POTASSIUM CHLORIDE 20 MEQ PO PACK: 40.0000 meq | PACK | Freq: Once | ORAL | Status: DC

## 2020-10-29 MED ORDER — POTASSIUM CHLORIDE 10 MEQ/100ML IV SOLN
10.0000 meq | INTRAVENOUS | Status: AC
  Administered 2020-10-29 (×4): 10 meq via INTRAVENOUS
  Filled 2020-10-29 (×4): qty 100

## 2020-10-29 MED ORDER — SODIUM CHLORIDE 0.9 % IV SOLN
INTRAVENOUS | Status: DC
  Administered 2020-10-31: 1000 mL via INTRAVENOUS

## 2020-10-29 MED ORDER — POTASSIUM CHLORIDE 10 MEQ/100ML IV SOLN: 10.0000 meq | INTRAVENOUS | Status: DC

## 2020-10-29 MED ORDER — INSULIN GLARGINE 100 UNIT/ML ~~LOC~~ SOLN
10.0000 [IU] | Freq: Every day | SUBCUTANEOUS | Status: DC
  Administered 2020-10-29 – 2020-11-02 (×5): 10 [IU] via SUBCUTANEOUS
  Filled 2020-10-29 (×6): qty 0.1

## 2020-10-29 MED ORDER — SODIUM CHLORIDE 0.9 % IV SOLN: 1.0000 g | INTRAVENOUS | Status: DC

## 2020-10-29 MED ORDER — ONDANSETRON HCL 4 MG/2ML IJ SOLN
4.0000 mg | Freq: Four times a day (QID) | INTRAMUSCULAR | Status: DC | PRN
  Administered 2020-10-29 (×2): 4 mg via INTRAVENOUS
  Filled 2020-10-29 (×2): qty 2

## 2020-10-29 MED ORDER — SODIUM CHLORIDE 0.9 % IV SOLN
3.0000 g | Freq: Four times a day (QID) | INTRAVENOUS | Status: DC
  Administered 2020-10-30 – 2020-11-02 (×13): 3 g via INTRAVENOUS
  Filled 2020-10-29 (×2): qty 8
  Filled 2020-10-29: qty 2.14
  Filled 2020-10-29: qty 8
  Filled 2020-10-29: qty 2.14
  Filled 2020-10-29 (×2): qty 8
  Filled 2020-10-29: qty 3
  Filled 2020-10-29 (×2): qty 8
  Filled 2020-10-29: qty 3
  Filled 2020-10-29: qty 2.14
  Filled 2020-10-29 (×3): qty 8
  Filled 2020-10-29 (×2): qty 2.14

## 2020-10-29 MED ORDER — INSULIN ASPART 100 UNIT/ML ~~LOC~~ SOLN
0.0000 [IU] | Freq: Three times a day (TID) | SUBCUTANEOUS | Status: DC
  Administered 2020-10-29: 2 [IU] via SUBCUTANEOUS
  Administered 2020-10-30 (×2): 5 [IU] via SUBCUTANEOUS
  Administered 2020-10-31: 8 [IU] via SUBCUTANEOUS
  Administered 2020-10-31: 3 [IU] via SUBCUTANEOUS
  Administered 2020-10-31: 11 [IU] via SUBCUTANEOUS
  Administered 2020-11-01 (×2): 5 [IU] via SUBCUTANEOUS
  Administered 2020-11-01 – 2020-11-02 (×2): 8 [IU] via SUBCUTANEOUS
  Administered 2020-11-02: 5 [IU] via SUBCUTANEOUS
  Filled 2020-10-29 (×11): qty 1

## 2020-10-29 MED ORDER — POTASSIUM CHLORIDE 10 MEQ/100ML IV SOLN
10.0000 meq | INTRAVENOUS | Status: AC
  Administered 2020-10-29 (×2): 10 meq via INTRAVENOUS
  Filled 2020-10-29 (×3): qty 100

## 2020-10-29 MED ORDER — SODIUM CHLORIDE 0.9 % IV SOLN
2.0000 g | INTRAVENOUS | Status: DC
  Filled 2020-10-29: qty 20

## 2020-10-29 MED ORDER — METRONIDAZOLE IN NACL 5-0.79 MG/ML-% IV SOLN
500.0000 mg | Freq: Three times a day (TID) | INTRAVENOUS | Status: AC
  Administered 2020-10-29 – 2020-10-30 (×2): 500 mg via INTRAVENOUS
  Filled 2020-10-29 (×2): qty 100

## 2020-10-29 MED ORDER — SODIUM CHLORIDE 0.9 % IV SOLN
2.0000 g | Freq: Once | INTRAVENOUS | Status: AC
  Administered 2020-10-29: 2 g via INTRAVENOUS

## 2020-10-29 NOTE — ED Notes (Signed)
Pt moved to hospital bed, brief changed, linens dry and intact.  Pur wik remains in place, but has limited suction due to vaginal bleeding.  Daughter remains at bedside will continue to monitor.

## 2020-10-29 NOTE — ED Notes (Signed)
Given sips of water to rinse mouth.

## 2020-10-29 NOTE — Progress Notes (Signed)
PROGRESS NOTE    Mindy Olson  [id number] DOB: 1956/08/03 DOA: 10/28/2020 PCP: System, Provider Not In   Brief Narrative:  This 65 years old female with unknown medical history who presents with C/o:  Nausea, vomiting,  diarrhea and generalized weakness.  Patient has not seen a provider in several years.  Patient also reports daily heavy abnormal uterine bleeding and has not been evaluated.  In the ED she is found to have diabetic ketoacidosis and UTI.  CT abdomen showed diffuse enlargement of the uterus with necrosis consistent with endometrial neoplasm. Patient is admitted for DKA  and was started on insulin drip.  Anion gap closed. DKA has resolved.  Patient is transitioned to long-acting and short-acting insulin.  Patient is started on IV cefepime and Flagyl for sepsis secondary to UTI.  OB/GYN consulted.  Patient is  scheduled to have endoscopic ultrasound and possible biopsy tomorrow.   Assessment & Plan:   Principal Problem:   DKA (diabetic ketoacidosis) ([place]) Active Problems:   Sepsis secondary to UTI ([place])   AKI (acute kidney injury) ([place])   Abnormal uterine and vaginal bleeding, unspecified   Elevated bilirubin   Severe sepsis secondary to UTI:   Patient presented with tachycardia, significant leukocytosis and lactate of 2.3 with positive UA She has initially received empiric cefepime and Flagyl in the ED. Antibiotic transitioned to ceftriaxone 2 g daily. Blood cultures positive for streptococci.. Infectious disease consulted for antibiotic recommendations. Lactic acid normalized with IV fluids.  DKA with newly diagnosed diabetes >> Improved Patient presented with glucose of 624 with anion gap of 39.  pH of 7.27, bicarb of 13.  Beta hydroxybutyrate greater than 8 Likely triggered by UTI.  Hemoglobin A1c 14.8 (uncontrolled)  -replete potassium as needed - continue insulin gtt with goal of 140-180 and AG <12 - IV NS until BG <250, then switch to D5 1/2 NS  -  BMP q4hr  - keep NPO  -Anion gap closed.  Patient has been transitioned to subcu insulin.  AKI >>> Resolved. pre-renal follow with repeat BMP while getting IV fluids  Postmenopausal abnormal uterine bleeding Patient has had symptoms for several years and has never seek evaluation Concerning for endometrial neoplasm GYN consulted.  Patient is scheduled for endoscopic ultrasound with possible biopsy tomorrow. Patient is well optimized for procedure. Hgb stable.  Elevated bilirubin Elevated bilirubin of 3.2.  Possibly reactive due to DKA.  Patient has history of cholecystectomy    DVT prophylaxis: SCDs Code Status: Full code Family Communication: No family at bedside Disposition Plan:  Status is: Inpatient  Remains inpatient appropriate because:Inpatient level of care appropriate due to severity of illness   Dispo: The patient is from: Home              Anticipated d/c is to: SNF              Anticipated d/c date is: > 3 days              Patient currently is not medically stable to d/c.   Difficult to place patient No   Consultants:   OB/GYN  Infectious disease  Procedures:  Antimicrobials:  Anti-infectives (From admission, onward)   Start     Dose/Rate Route Frequency Ordered Stop   10/30/20 1000  cefTRIAXone (ROCEPHIN) 2 g in sodium chloride 0.9 % 100 mL IVPB        2 g 200 mL/hr over 30 Minutes Intravenous Every 24 hours 10/29/20 1046  10/30/20 0000  cefTRIAXone (ROCEPHIN) 1 g in sodium chloride 0.9 % 100 mL IVPB  Status:  Discontinued        1 g 200 mL/hr over 30 Minutes Intravenous Every 24 hours 10/29/20 0014 10/29/20 1046   10/28/20 1930  ceFEPIme (MAXIPIME) 2 g in sodium chloride 0.9 % 100 mL IVPB        2 g 200 mL/hr over 30 Minutes Intravenous  Once 10/28/20 1919 10/28/20 2102   10/28/20 1930  metroNIDAZOLE (FLAGYL) IVPB 500 mg        500 mg 100 mL/hr over 60 Minutes Intravenous  Once 10/28/20 1919 10/28/20 2207      Subjective: Patient was  seen and examined at bedside.  Overnight events noted.   Patient reports feeling generally weak,  denies any dizziness, chest pain. Patient reports persistent nausea and vomiting.  Objective: Vitals:   10/29/20 0930 10/29/20 1000 10/29/20 1030 10/29/20 1100  BP: 127/70 133/76 128/62 119/60  Pulse: (!) 108 (!) 109 (!) 110 (!) 110  Resp: 12 15 16 14   Temp:      TempSrc:      SpO2: 99% 100% 100% 98%  Weight:      Height:       No intake or output data in the 24 hours ending 10/29/20 1513 Filed Weights   10/28/20 1733  Weight: 50.8 kg    Examination:  General exam: Appears calm and comfortable, not in any acute distress. Respiratory system: Clear to auscultation. Respiratory effort normal. Cardiovascular system: S1 & S2 heard, RRR. No JVD, murmurs, rubs, gallops or clicks. No pedal edema. Gastrointestinal system: Abdomen is nondistended, soft and nontender. No organomegaly or masses felt. Normal bowel sounds heard. Central nervous system: Alert and oriented. No focal neurological deficits. Extremities: Symmetric 5 x 5 power. Skin: No rashes, lesions or ulcers Psychiatry: Judgement and insight appear normal. Mood & affect appropriate.     Data Reviewed: I have personally reviewed following labs and imaging studies  CBC: Recent Labs  Lab 10/28/20 1737 10/29/20 1219  WBC 39.8*  --   NEUTROABS 36.7*  --   HGB 12.6 11.1*  HCT 40.6 35.2*  MCV 81.0  --   PLT 544*  --    Basic Metabolic Panel: Recent Labs  Lab 10/28/20 1737 10/28/20 2349 10/29/20 0314 10/29/20 0841 10/29/20 1219  NA 138 143 142 142 145  K 3.6 3.3* 3.1* 2.9* 3.1*  CL 87* 98 101 104 103  CO2 13* 21* 28 29 28   GLUCOSE 624* 216* 234* 234* 198*  BUN 28* 24* 22 17 16   CREATININE 1.11* 0.90 0.71 0.44 0.47  CALCIUM 9.7 9.4 8.9 8.7* 9.3   GFR: Estimated Creatinine Clearance: 56.2 mL/min (by C-G formula based on SCr of 0.47 mg/dL). Liver Function Tests: Recent Labs  Lab 10/28/20 1737  AST 16  ALT 14   ALKPHOS 217*  BILITOT 3.2*  PROT 6.9  ALBUMIN 2.7*   No results for input(s): LIPASE, AMYLASE in the last 168 hours. No results for input(s): AMMONIA in the last 168 hours. Coagulation Profile: No results for input(s): INR, PROTIME in the last 168 hours. Cardiac Enzymes: No results for input(s): CKTOTAL, CKMB, CKMBINDEX, TROPONINI in the last 168 hours. BNP (last 3 results) No results for input(s): PROBNP in the last 8760 hours. HbA1C: Recent Labs    10/28/20 2349  HGBA1C 14.8*   CBG: Recent Labs  Lab 10/29/20 0931 10/29/20 1042 10/29/20 1146 10/29/20 1312 10/29/20 1416  GLUCAP 176* 188*  155* 186* 187*   Lipid Profile: No results for input(s): CHOL, HDL, LDLCALC, TRIG, CHOLHDL, LDLDIRECT in the last 72 hours. Thyroid Function Tests: No results for input(s): TSH, T4TOTAL, FREET4, T3FREE, THYROIDAB in the last 72 hours. Anemia Panel: No results for input(s): VITAMINB12, FOLATE, FERRITIN, TIBC, IRON, RETICCTPCT in the last 72 hours. Sepsis Labs: Recent Labs  Lab 10/28/20 1834 10/28/20 2043 10/29/20 0116 10/29/20 0314  LATICACIDVEN 2.2* 2.3* 2.1* 1.7    Recent Results (from the past 240 hour(s))  SARS Coronavirus 2 by RT PCR (hospital order, performed in [facility] hospital lab) Nasopharyngeal Nasopharyngeal Swab     Status: None   Collection Time: 10/28/20  6:44 PM   Specimen: Nasopharyngeal Swab  Result Value Ref Range Status   SARS Coronavirus 2 NEGATIVE NEGATIVE Final    Comment: (NOTE) SARS-CoV-2 target nucleic acids are NOT DETECTED.  The SARS-CoV-2 RNA is generally detectable in upper and lower respiratory specimens during the acute phase of infection. The lowest concentration of SARS-CoV-2 viral copies this assay can detect is 250 copies / mL. A negative result does not preclude SARS-CoV-2 infection and should not be used as the sole basis for treatment or other patient management decisions.  A negative result may occur with improper specimen  collection / handling, submission of specimen other than nasopharyngeal swab, presence of viral mutation(s) within the areas targeted by this assay, and inadequate number of viral copies (<250 copies / mL). A negative result must be combined with clinical observations, patient history, and epidemiological information.  Fact Sheet for Patients:   StrictlyIdeas.no  Fact Sheet for Healthcare Providers: BankingDealers.co.za  This test is not yet approved or  cleared by the [place] FDA and has been authorized for detection and/or diagnosis of SARS-CoV-2 by FDA under an Emergency Use Authorization (EUA).  This EUA will remain in effect (meaning this test can be used) for the duration of the COVID-19 declaration under Section 564(b)(1) of the Act, 21 U.S.C. section 360bbb-3(b)(1), unless the authorization is terminated or revoked sooner.  Performed at [facility], [address] [place], [place] [address]   Blood culture (single)     Status: None (Preliminary result)   Collection Time: 10/28/20  7:26 PM   Specimen: BLOOD  Result Value Ref Range Status   Specimen Description BLOOD RIGHT ANTECUBITAL  Final   Special Requests   Final    BOTTLES DRAWN AEROBIC AND ANAEROBIC Blood Culture adequate volume   Culture  Setup Time   Final    GRAM POSITIVE COCCI IN BOTH AEROBIC AND ANAEROBIC BOTTLES Organism ID to follow CRITICAL RESULT CALLED TO, READ BACK BY AND VERIFIED WITH: S.HALLAJI,PHARMD AT 1008 ON 10/29/20 BY GM Performed at [facility], [address] [place], [place] [address]    Culture GRAM POSITIVE COCCI  Final   Report Status PENDING  Incomplete  Blood Culture ID Panel (Reflexed)     Status: Abnormal   Collection Time: 10/28/20  7:26 PM  Result Value Ref Range Status   Enterococcus faecalis NOT DETECTED NOT DETECTED Final   Enterococcus Faecium NOT DETECTED NOT DETECTED Final   Listeria monocytogenes  NOT DETECTED NOT DETECTED Final   Staphylococcus species NOT DETECTED NOT DETECTED Final   Staphylococcus aureus (BCID) NOT DETECTED NOT DETECTED Final   Staphylococcus epidermidis NOT DETECTED NOT DETECTED Final   Staphylococcus lugdunensis NOT DETECTED NOT DETECTED Final   Streptococcus species DETECTED (A) NOT DETECTED Final    Comment: Not Enterococcus species, Streptococcus agalactiae,  Streptococcus pyogenes, or Streptococcus pneumoniae. CRITICAL RESULT CALLED TO, READ BACK BY AND VERIFIED WITH: S.HALLAJI,PHARMD AT 1008 ON 10/29/20 BY GM    Streptococcus agalactiae NOT DETECTED NOT DETECTED Final   Streptococcus pneumoniae NOT DETECTED NOT DETECTED Final   Streptococcus pyogenes NOT DETECTED NOT DETECTED Final   A.calcoaceticus-baumannii NOT DETECTED NOT DETECTED Final   Bacteroides fragilis NOT DETECTED NOT DETECTED Final   Enterobacterales NOT DETECTED NOT DETECTED Final   Enterobacter cloacae complex NOT DETECTED NOT DETECTED Final   Escherichia coli NOT DETECTED NOT DETECTED Final   Klebsiella aerogenes NOT DETECTED NOT DETECTED Final   Klebsiella oxytoca NOT DETECTED NOT DETECTED Final   Klebsiella pneumoniae NOT DETECTED NOT DETECTED Final   Proteus species NOT DETECTED NOT DETECTED Final   Salmonella species NOT DETECTED NOT DETECTED Final   Serratia marcescens NOT DETECTED NOT DETECTED Final   Haemophilus influenzae NOT DETECTED NOT DETECTED Final   Neisseria meningitidis NOT DETECTED NOT DETECTED Final   Pseudomonas aeruginosa NOT DETECTED NOT DETECTED Final   Stenotrophomonas maltophilia NOT DETECTED NOT DETECTED Final   Candida albicans NOT DETECTED NOT DETECTED Final   Candida auris NOT DETECTED NOT DETECTED Final   Candida glabrata NOT DETECTED NOT DETECTED Final   Candida krusei NOT DETECTED NOT DETECTED Final   Candida parapsilosis NOT DETECTED NOT DETECTED Final   Candida tropicalis NOT DETECTED NOT DETECTED Final   Cryptococcus neoformans/gattii NOT DETECTED NOT  DETECTED Final    Comment: Performed at [facility], [address] [place], [place] [address]  Culture, blood (single)     Status: None (Preliminary result)   Collection Time: 10/28/20  8:43 PM   Specimen: BLOOD  Result Value Ref Range Status   Specimen Description BLOOD BLOOD RIGHT WRIST  Final   Special Requests   Final    BOTTLES DRAWN AEROBIC AND ANAEROBIC Blood Culture adequate volume   Culture   Final    NO GROWTH < 12 HOURS Performed at [facility], [address] [place], [place] [address]    Report Status PENDING  Incomplete         Radiology Studies: CT ABDOMEN PELVIS W CONTRAST  Result Date: 10/28/2020 CLINICAL DATA:  Nausea and vomiting with weight loss for several months EXAM: CT ABDOMEN AND PELVIS WITH CONTRAST TECHNIQUE: Multidetector CT imaging of the abdomen and pelvis was performed using the standard protocol following bolus administration of intravenous contrast. CONTRAST:  6mL OMNIPAQUE IOHEXOL 300 MG/ML  SOLN COMPARISON:  None. FINDINGS: Lower chest: No acute abnormality. Hepatobiliary: Liver is well visualized and within normal limits. Gallbladder is not well seen and may have been surgically removed. Clinical correlation is recommended. Pancreas: Unremarkable. No pancreatic ductal dilatation or surrounding inflammatory changes. Spleen: Normal in size without focal abnormality. Adrenals/Urinary Tract: Adrenal glands are within normal limits. Kidneys demonstrate a normal enhancement pattern bilaterally. Fullness of the right renal collecting system is noted secondary to extrinsic compression by the prominent uterus. Bladder is somewhat distorted secondary to the enlarged uterus but is otherwise well distended. Stomach/Bowel: Mild diverticular change of the colon is noted without evidence of diverticulitis. The appendix is within normal limits. No small bowel abnormality is seen. Small sliding-type hiatal hernia is noted. Vascular/Lymphatic:  Abdominal aorta shows no aneurysmal dilatation. Prominent centrally necrotic left iliac lymph node is noted measuring 2.2 cm in short axis. No other sizable adenopathy is noted. Reproductive: The uterus is significantly enlarged measuring 8.7 by 9.9 cm in greatest AP and transverse dimensions respectively. It  is elongated and diffusely decreased in attenuation with peripheral enhancement highly suggestive of endometrial neoplasm. These changes extend into the cervical canal and may extend into the vaginal vault on the sagittal images. No definitive adnexal mass is noted. Other: No free fluid is seen. No definitive endometrial deposits are noted at this time. Postsurgical changes in the anterior abdominal wall are seen. Musculoskeletal: No acute or significant osseous findings. 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 This extends into the cervical canal and likely into the vaginal vault. Associated left iliac adenopathy is noted of a similar appearance with central necrosis. Given its diffuse distribution throughout the entire uterus, this is not felt to represent fibroid change. Ultrasound is not likely to be helpful as a diagnostic tool. Direct visualization is recommended. Tissue sampling is also recommended. Surgical consultation is also recommended. Diverticulosis without diverticulitis Prominence of the right renal collecting system and proximal right ureter secondary to extrinsic compression by the enlarged uterus. No true obstructing lesion is noted within the ureter. Electronically Signed   By: Inez Catalina M.D.   On: 10/28/2020 21:56   DG Chest Portable 1 View  Result Date: 10/28/2020 CLINICAL DATA:  Shortness of breath EXAM: PORTABLE CHEST 1 VIEW COMPARISON:  None. FINDINGS: The heart size and mediastinal contours are within normal limits. Both lungs are clear. The  visualized skeletal structures are unremarkable. IMPRESSION: No active disease. Electronically Signed   By: Inez Catalina M.D.   On: 10/28/2020 19:40   Scheduled Meds: . insulin aspart  0-15 Units Subcutaneous TID WC  . insulin glargine  10 Units Subcutaneous Daily   Continuous Infusions: . [START ON 10/30/2020] cefTRIAXone (ROCEPHIN)  IV    . dextrose 5% lactated ringers 125 mL/hr at 10/29/20 0102  . insulin 2.6 Units/hr (10/29/20 1045)  . lactated ringers 125 mL/hr at 10/29/20 0601  . potassium chloride 10 mEq (10/29/20 1431)     LOS: 1 day    Time spent: 35 mins    Fernie Grimm, MD [facility]   If 7PM-7AM, please contact night-coverage

## 2020-10-29 NOTE — H&P (Signed)
History and Physical    Mindy Olson [id number] DOB: 1955/10/02 DOA: 10/28/2020  PCP: System, Provider Not In  Patient coming from: Home, daughter bedside  I have personally briefly reviewed patient's old medical records in [place]  Chief Complaint: Nausea, vomiting and fatigue  HPI: Mindy Olson is a 65 y.o. female with unknown medical history who presents with complaints of nausea, vomiting diarrhea and weakness.  Patient has not seen a provider for many years.  This past week she has noticed occasional right lower quadrant abdominal pain and has had persistent nausea, vomiting and some diarrhea.  She denies any dysuria, increased urgency and frequency.  She has history of cholecystectomy.  She also notes that for the past several years she has been having daily heavy abnormal uterine bleeding.  Using several menstrual pads daily.  She has not seen anyone to get this evaluated. She denies tobacco use.  Has occasional alcohol use.  In the ED, she was septic with tachycardia and significant leukocytosis up to 39.8.  Lactate of 2.3.  UA was positive.  Glucose of 624 with anion gap of 39.  Bicarb of 13.  Beta hydroxybutyrate of greater than 8.  pH stable to 7.27  CT abdomen showed diffuse enlargement of the uterus with necrosis consistent with endometrial neoplasm.  There is also prominence of the right renal collecting system and right ureter due to extrinsic compression by enlarged uterus.  She was initially given empiric IV cefepime and Flagyl by ED physician prior to UA results. Review of Systems: Constitutional: No Weight Change, No Fever ENT/Mouth: No sore throat, No Rhinorrhea Eyes: No Eye Pain, No Vision Changes Cardiovascular: No Chest Pain, no SOB Respiratory: No Cough, No Sputum, No Wheezing, no Dyspnea  Gastrointestinal: + Nausea, + Vomiting, + Diarrhea, No Constipation,+ Pain Genitourinary: no Urinary Incontinence, No Urgency, No Flank  Pain Musculoskeletal: No Arthralgias, No Myalgias Skin: No Skin Lesions, No Pruritus, Neuro: + Weakness, No Numbness,  No Loss of Consciousness, No Syncope Psych: No Anxiety/Panic, No Depression, no decrease appetite Heme/Lymph: No Bruising, No Bleeding  History reviewed. No pertinent past medical history.  Surgical history Cholecystectomy   reports that she has never smoked. She has never used smokeless tobacco. She reports previous alcohol use. She reports previous drug use. Social History  No Known Allergies  Family history Type 2 diabetes-father, mother Lung cancer-mother Colon cancer-brother  Prior to Admission medications   Not on File    Physical Exam: Vitals:   10/28/20 1835 10/28/20 2100 10/28/20 2230 10/28/20 2300  BP: 133/84 111/63 (!) 115/53 (!) 106/49  Pulse: (!) 129 (!) 131 (!) 131 (!) 118  Resp: 17 20 (!) 21 (!) 23  Temp: 97.6 F (36.4 C)     TempSrc:      SpO2: 100% 99% 99% 97%  Weight:      Height:        Constitutional: Thin ill-appearing elderly female lying flat in bed actively vomiting Vitals:   10/28/20 1835 10/28/20 2100 10/28/20 2230 10/28/20 2300  BP: 133/84 111/63 (!) 115/53 (!) 106/49  Pulse: (!) 129 (!) 131 (!) 131 (!) 118  Resp: 17 20 (!) 21 (!) 23  Temp: 97.6 F (36.4 C)     TempSrc:      SpO2: 100% 99% 99% 97%  Weight:      Height:       Eyes: PERRL, lids and conjunctivae normal ENMT: Mucous membranes are moist.  Neck: normal, supple Respiratory: clear to  auscultation bilaterally, no wheezing, no crackles. Normal respiratory effort. No accessory muscle use.  Cardiovascular: Tachycardia, no murmurs / rubs / gallops. No extremity edema.  No carotid bruits.  Abdomen: Mild right-sided lower abdominal quadrant tenderness, no masses palpated.  Bowel sounds positive.  Musculoskeletal: no clubbing / cyanosis. No joint deformity upper and lower extremities. Normal muscle tone.  Skin: no rashes, lesions, ulcers. No  induration Neurologic: CN 2-12 grossly intact.  Strength 4 out of 5 of all extremities.  Psychiatric: Normal judgment and insight. Alert and oriented x 3. Normal mood.     Labs on Admission: I have personally reviewed following labs and imaging studies  CBC: Recent Labs  Lab 10/28/20 1737  WBC 39.8*  NEUTROABS 36.7*  HGB 12.6  HCT 40.6  MCV 81.0  PLT 161*   Basic Metabolic Panel: Recent Labs  Lab 10/28/20 1737  NA 138  K 3.6  CL 87*  CO2 13*  GLUCOSE 624*  BUN 28*  CREATININE 1.11*  CALCIUM 9.7   GFR: Estimated Creatinine Clearance: 40.5 mL/min (A) (by C-G formula based on SCr of 1.11 mg/dL (H)). Liver Function Tests: Recent Labs  Lab 10/28/20 1737  AST 16  ALT 14  ALKPHOS 217*  BILITOT 3.2*  PROT 6.9  ALBUMIN 2.7*   No results for input(s): LIPASE, AMYLASE in the last 168 hours. No results for input(s): AMMONIA in the last 168 hours. Coagulation Profile: No results for input(s): INR, PROTIME in the last 168 hours. Cardiac Enzymes: No results for input(s): CKTOTAL, CKMB, CKMBINDEX, TROPONINI in the last 168 hours. BNP (last 3 results) No results for input(s): PROBNP in the last 8760 hours. HbA1C: No results for input(s): HGBA1C in the last 72 hours. CBG: Recent Labs  Lab 10/28/20 1740 10/28/20 1958 10/28/20 2034 10/28/20 2100 10/28/20 2149  GLUCAP 592* 522* 452* 441* 338*   Lipid Profile: No results for input(s): CHOL, HDL, LDLCALC, TRIG, CHOLHDL, LDLDIRECT in the last 72 hours. Thyroid Function Tests: No results for input(s): TSH, T4TOTAL, FREET4, T3FREE, THYROIDAB in the last 72 hours. Anemia Panel: No results for input(s): VITAMINB12, FOLATE, FERRITIN, TIBC, IRON, RETICCTPCT in the last 72 hours. Urine analysis:    Component Value Date/Time   COLORURINE AMBER (A) 10/28/2020 2252   APPEARANCEUR CLOUDY (A) 10/28/2020 2252   LABSPEC 1.021 10/28/2020 2252   PHURINE 5.0 10/28/2020 2252   GLUCOSEU >=500 (A) 10/28/2020 2252   HGBUR LARGE (A)  10/28/2020 2252   BILIRUBINUR NEGATIVE 10/28/2020 2252   KETONESUR 80 (A) 10/28/2020 2252   PROTEINUR 100 (A) 10/28/2020 2252   NITRITE NEGATIVE 10/28/2020 2252   LEUKOCYTESUR MODERATE (A) 10/28/2020 2252    Radiological Exams on Admission: CT ABDOMEN PELVIS W CONTRAST  Result Date: 10/28/2020 CLINICAL DATA:  Nausea and vomiting with weight loss for several months EXAM: CT ABDOMEN AND PELVIS WITH CONTRAST TECHNIQUE: Multidetector CT imaging of the abdomen and pelvis was performed using the standard protocol following bolus administration of intravenous contrast. CONTRAST:  41mL OMNIPAQUE IOHEXOL 300 MG/ML  SOLN COMPARISON:  None. FINDINGS: Lower chest: No acute abnormality. Hepatobiliary: Liver is well visualized and within normal limits. Gallbladder is not well seen and may have been surgically removed. Clinical correlation is recommended. Pancreas: Unremarkable. No pancreatic ductal dilatation or surrounding inflammatory changes. Spleen: Normal in size without focal abnormality. Adrenals/Urinary Tract: Adrenal glands are within normal limits. Kidneys demonstrate a normal enhancement pattern bilaterally. Fullness of the right renal collecting system is noted secondary to extrinsic compression by the prominent uterus. Bladder  is somewhat distorted secondary to the enlarged uterus but is otherwise well distended. Stomach/Bowel: Mild diverticular change of the colon is noted without evidence of diverticulitis. The appendix is within normal limits. No small bowel abnormality is seen. Small sliding-type hiatal hernia is noted. Vascular/Lymphatic: Abdominal aorta shows no aneurysmal dilatation. Prominent centrally necrotic left iliac lymph node is noted measuring 2.2 cm in short axis. No other sizable adenopathy is noted. Reproductive: The uterus is significantly enlarged measuring 8.7 by 9.9 cm in greatest AP and transverse dimensions respectively. It is elongated and diffusely decreased in attenuation with  peripheral enhancement highly suggestive of endometrial neoplasm. These changes extend into the cervical canal and may extend into the vaginal vault on the sagittal images. No definitive adnexal mass is noted. Other: No free fluid is seen. No definitive endometrial deposits are noted at this time. Postsurgical changes in the anterior abdominal wall are seen. Musculoskeletal: No acute or significant osseous findings. 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 This extends into the cervical canal and likely into the vaginal vault. Associated left iliac adenopathy is noted of a similar appearance with central necrosis. Given its diffuse distribution throughout the entire uterus, this is not felt to represent fibroid change. Ultrasound is not likely to be helpful as a diagnostic tool. Direct visualization is recommended. Tissue sampling is also recommended. Surgical consultation is also recommended. Diverticulosis without diverticulitis Prominence of the right renal collecting system and proximal right ureter secondary to extrinsic compression by the enlarged uterus. No true obstructing lesion is noted within the ureter. Electronically Signed   By: Inez Catalina M.D.   On: 10/28/2020 21:56   DG Chest Portable 1 View  Result Date: 10/28/2020 CLINICAL DATA:  Shortness of breath EXAM: PORTABLE CHEST 1 VIEW COMPARISON:  None. FINDINGS: The heart size and mediastinal contours are within normal limits. Both lungs are clear. The visualized skeletal structures are unremarkable. IMPRESSION: No active disease. Electronically Signed   By: Inez Catalina M.D.   On: 10/28/2020 19:40      Assessment/Plan  Severe sepsis secondary to UTI  Patient presented with tachycardia, significant leukocytosis and lactate of 2.3 with positive UA She has initially received empiric cefepime and Flagyl in the  ED will transition to IV Rocephin  DKA with newly diagnosed diabetes Patient presented with glucose of 624 with anion gap of 39.  pH of 7.27, bicarb of 13.  Beta hydroxybutyrate greater than 8 Likely triggered by UTI Check HbA1C  -replete potassium as needed - continue insulin gtt with goal of 140-180 and AG <12 - IV NS until BG <250, then switch to D5 1/2 NS  - BMP q4hr  - keep NPO   AKI pre-renal follow with repeat BMP while getting IV fluids  Postmenopausal abnormal uterine bleeding Patient has had symptoms for several years and has never seek evaluation Concerning for endometrial neoplasm Will need to see a gynecologist for biopsy/direct visualization once DKA resolved Hgb stable   Elevated bilirubin Elevated bilirubin of 3.2.  Possibly reactive due to DKA.  Patient has history of cholecystectomy  DVT prophylaxis:SCDs Code Status: Full Family Communication: Plan discussed with patient and daughter at bedside  disposition Plan: Home with at least 2 midnight stays  Consults called:  Admission status: inpatient    Level of care: Stepdown  Status is: Inpatient  Remains inpatient appropriate because:Inpatient level of care appropriate due to severity of illness   Dispo:  The patient is from: Home              Anticipated d/c is to: Home              Anticipated d/c date is: > 3 days              Patient currently is not medically stable to d/c.   Difficult to place patient No         Orene Desanctis DO [facility]   If 7PM-7AM, please contact night-coverage [email or web address]   10/29/2020, 12:06 AM

## 2020-10-29 NOTE — Progress Notes (Signed)
PHARMACY - PHYSICIAN COMMUNICATION CRITICAL VALUE ALERT - BLOOD CULTURE IDENTIFICATION (BCID)  Mindy Olson is an 65 y.o. female who presented to [facility] on 10/28/2020 with a chief complaint of nausea, vomiting, polyuria, and polydipsia. Additional complaints of indigestion, abdominal cramping, fatigue, and shortness of breath. Pt here with new onset diabetes and DKA. CT 1/30 concerning for enlarged uterus and endometrial carcinoma. No pertinent PMH. Pt afebrile, WBC 39.8, LA 2.3>2.1>1.7.  Assessment:  2/4 bottle, 1/2 sets blood cultures positive for streptococcus species  Name of physician (or Provider) Contacted: Dwyane Dee  Current antibiotics: ceftriaxone 1g IV q24h  Changes to prescribed antibiotics recommended:  Patient is on recommended antibiotics - will increase ceftriaxone dose to 2g IV q24h   Results for orders placed or performed during the hospital encounter of 10/28/20  Blood Culture ID Panel (Reflexed) (Collected: 10/28/2020  7:26 PM)  Result Value Ref Range   Enterococcus faecalis NOT DETECTED NOT DETECTED   Enterococcus Faecium NOT DETECTED NOT DETECTED   Listeria monocytogenes NOT DETECTED NOT DETECTED   Staphylococcus species NOT DETECTED NOT DETECTED   Staphylococcus aureus (BCID) NOT DETECTED NOT DETECTED   Staphylococcus epidermidis NOT DETECTED NOT DETECTED   Staphylococcus lugdunensis NOT DETECTED NOT DETECTED   Streptococcus species DETECTED (A) NOT DETECTED   Streptococcus agalactiae NOT DETECTED NOT DETECTED   Streptococcus pneumoniae NOT DETECTED NOT DETECTED   Streptococcus pyogenes NOT DETECTED NOT DETECTED   A.calcoaceticus-baumannii NOT DETECTED NOT DETECTED   Bacteroides fragilis NOT DETECTED NOT DETECTED   Enterobacterales NOT DETECTED NOT DETECTED   Enterobacter cloacae complex NOT DETECTED NOT DETECTED   Escherichia coli NOT DETECTED NOT DETECTED   Klebsiella aerogenes NOT DETECTED NOT DETECTED   Klebsiella oxytoca NOT DETECTED NOT DETECTED    Klebsiella pneumoniae NOT DETECTED NOT DETECTED   Proteus species NOT DETECTED NOT DETECTED   Salmonella species NOT DETECTED NOT DETECTED   Serratia marcescens NOT DETECTED NOT DETECTED   Haemophilus influenzae NOT DETECTED NOT DETECTED   Neisseria meningitidis NOT DETECTED NOT DETECTED   Pseudomonas aeruginosa NOT DETECTED NOT DETECTED   Stenotrophomonas maltophilia NOT DETECTED NOT DETECTED   Candida albicans NOT DETECTED NOT DETECTED   Candida auris NOT DETECTED NOT DETECTED   Candida glabrata NOT DETECTED NOT DETECTED   Candida krusei NOT DETECTED NOT DETECTED   Candida parapsilosis NOT DETECTED NOT DETECTED   Candida tropicalis NOT DETECTED NOT DETECTED   Cryptococcus neoformans/gattii NOT DETECTED NOT DETECTED   Benn Moulder, PharmD Pharmacy Resident  10/29/2020 10:57 AM

## 2020-10-29 NOTE — ED Notes (Signed)
Claris Gladden, RN assisting this RN with most of patient care, medications, and CBG checks.

## 2020-10-29 NOTE — ED Notes (Signed)
Pt states coming in  Due to weakness and vomiting. Pt states pain to the stomach is hunger pains. As per previous RN, pt is NPO for surgery in the AM. Pt is on cardiac, bp and pulse ox monitor.

## 2020-10-29 NOTE — Progress Notes (Addendum)
Inpatient Diabetes Program Recommendations  AACE/ADA: New Consensus Statement on Inpatient Glycemic Control (2015)  Target Ranges:  Prepandial:   less than 140 mg/dL      Peak postprandial:   less than 180 mg/dL (1-2 hours)      Critically ill patients:  140 - 180 mg/dL   Lab Results  Component Value Date   GLUCAP 200 (H) 10/29/2020   HGBA1C 14.8 (H) 10/28/2020    Review of Glycemic Control  Diabetes history: No prior hx Current orders for Inpatient glycemic control: IV insulin  Inpatient Diabetes Program Recommendations:   When patient meets criteria for transition from IV insulin, consider: -Levemir 7 units bid (0.3 units/kg x 50.8 kg = 15.24) -Novolog 0-9 units correction q 4 hrs. While NPO then tid + hs 0-5 units  Please given basal insulin 2 hrs prior to insulin drip discontinued and cover CBG with Novolog correction when IV insulin discontinued.  Will plan to see patient regarding new onset diabetes with A1c 14.8. Consults placed to dietician and Transition of Care.  Thank you, Nani Gasser. Juanette Urizar, RN, MSN, CDE  Diabetes Coordinator Inpatient Glycemic Control Team Team Pager [phone] (8am-5pm) 10/29/2020 9:16 AM

## 2020-10-29 NOTE — ED Notes (Signed)
Patient resting comfortably. Claris Gladden, RN assisting this RN with patient care.  Patient's bed and brief changed by assisting RN. New purewick placed and secured.

## 2020-10-29 NOTE — Consult Note (Signed)
NAME: Mindy Olson  DOB: 1956/09/18  MRN: [id number]  Date/Time: 10/29/2020 4:41 PM  REQUESTING PROVIDER: Dwyane Dee Subjective:  REASON FOR CONSULT: strep bacteremia Limited /reluctant historian- daughter at bed side? Mindy Olson is a 65 y.o. female presenting with weight loss of 6 months and difficulty keeping food down. 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 Seen by Gyn and will be taken for EUA tomorrow I am seeing the patient for bacteremia  Lives on her own  PMH- none PSH- cholecystectomy c-section  Social History   Socioeconomic History  . Marital status: Married    Spouse name: Not on file  . Number of children: Not on file  . Years of education: Not on file  . Highest education level: Not on file  Occupational History  . Not on file  Tobacco Use  . Smoking status: Never Smoker  . Smokeless tobacco: Never Used  Substance and Sexual Activity  . Alcohol use: Not Currently  . Drug use: Not Currently  . Sexual activity: Not on file  Other Topics Concern  . Not on file  Social History Narrative  . Not on file   Social Determinants of Health   Financial Resource Strain: Not on file  Food Insecurity: Not on file  Transportation Needs: Not on file  Physical Activity: Not on file  Stress: Not on file  Social Connections: Not on file  Intimate Partner Violence: Not on file    No family history on file. No Known Allergies  ? Current Facility-Administered Medications  Medication Dose Route Frequency Provider Last Rate Last Admin  . [START ON 10/30/2020] cefTRIAXone (ROCEPHIN) 2 g in sodium chloride 0.9 % 100 mL IVPB  2 g Intravenous Q24H Benn Moulder, RPH      .  dextrose 5 % in lactated ringers infusion   Intravenous Continuous Duffy Bruce, MD 125 mL/hr at 10/29/20 0102 New Bag at 10/29/20 0102  . dextrose 50 % solution 0-50 mL  0-50 mL Intravenous PRN Duffy Bruce, MD      . insulin aspart (novoLOG) injection 0-15 Units  0-15 Units Subcutaneous TID WC Shawna Clamp, MD      . insulin glargine (LANTUS) injection 10 Units  10 Units Subcutaneous Daily Shawna Clamp, MD   10 Units at 10/29/20 1419  . lactated ringers infusion   Intravenous Continuous Duffy Bruce, MD   Stopped at 10/29/20 0700  . ondansetron (ZOFRAN) injection 4 mg  4 mg Intravenous Q6H PRN Tu, Ching T, DO   4 mg at 10/29/20 0905  . phenol (CHLORASEPTIC) mouth spray 1 spray  1 spray Mouth/Throat PRN Duffy Bruce, MD      . potassium chloride (KLOR-CON) packet 40 mEq  40 mEq Oral Once Shawna Clamp, MD      . potassium chloride 10 mEq in 100 mL IVPB  10 mEq Intravenous Q1 Hr x 2 Shawna Clamp, MD       No current outpatient medications on file.     Abtx:  Anti-infectives (From admission, onward)   Start     Dose/Rate Route Frequency Ordered Stop   10/30/20 1000  cefTRIAXone (ROCEPHIN) 2 g in sodium chloride 0.9 % 100 mL IVPB  2 g 200 mL/hr over 30 Minutes Intravenous Every 24 hours 10/29/20 1046     10/30/20 0000  cefTRIAXone (ROCEPHIN) 1 g in sodium chloride 0.9 % 100 mL IVPB  Status:  Discontinued        1 g 200 mL/hr over 30 Minutes Intravenous Every 24 hours 10/29/20 0014 10/29/20 1046   10/28/20 1930  ceFEPIme (MAXIPIME) 2 g in sodium chloride 0.9 % 100 mL IVPB        2 g 200 mL/hr over 30 Minutes Intravenous  Once 10/28/20 1919 10/28/20 2102   10/28/20 1930  metroNIDAZOLE (FLAGYL) IVPB 500 mg        500 mg 100 mL/hr over 60 Minutes Intravenous  Once 10/28/20 1919 10/28/20 2207      REVIEW OF SYSTEMS:  Const: negative fever, negative chills, negative weight loss Eyes: negative diplopia or visual changes, negative eye pain ENT: negative coryza,  negative sore throat Resp: negative cough, hemoptysis, dyspnea Cards: negative for chest pain, palpitations, lower extremity edema GU: negative for frequency, dysuria and hematuria GI: ++ abdominal pain, no diarrhea, bleeding, constipation Skin: negative for rash and pruritus Heme: negative for easy bruising and gum/nose bleeding MS: weakness Neurolo:negative for headaches, ++dizziness, poor balance Psych: negative for feelings of anxiety, depression  Endocrine: negative for thyroid, diabetes Allergy/Immunology- negative for any medication or food allergies ? Objective:  VITALS:  BP 130/71   Pulse 95   Temp 97.6 F (36.4 C)   Resp 18   Ht _0  (1.626 m)   Wt 50.8 kg   SpO2 97%   BMI 19.22 kg/m  PHYSICAL EXAM:  General: Alert, cooperative, no distress,ppears stated ag ae.  Head: Normocephalic, without obvious abnormality, atraumatic. Eyes: Conjunctivae clear, anicteric sclerae. Pupils are equal ENT Nares normal. No drainage or sinus tenderness. Lips, mucosa, and tongue normal. No Thrush Neck: Supple, symmetrical, no adenopathy, thyroid: non tender no carotid bruit and no JVD. Back: No CVA tenderness. Lungs: did not examine Heart:did not examine Abdomen: Soft,mass felt  upto umbilicus Extremities: atraumatic, no cyanosis. No edema. No clubbing Skin: No rashes or lesions. Or bruising Lymph: Cervical, supraclavicular normal. Neurologic: Grossly non-focal Pertinent Labs Lab Results CBC    Component Value Date/Time   WBC 39.8 (H) 10/28/2020 1737   RBC 5.01 10/28/2020 1737   HGB 11.1 (L) 10/29/2020 1219   HCT 35.2 (L) 10/29/2020 1219   PLT 544 (H) 10/28/2020 1737   MCV 81.0 10/28/2020 1737   MCH 25.1 (L) 10/28/2020 1737   MCHC 31.0 10/28/2020 1737   RDW 15.0 10/28/2020 1737   LYMPHSABS 0.9 10/28/2020 1737   MONOABS 1.0 10/28/2020 1737   EOSABS 0.0 10/28/2020 1737   BASOSABS 0.0 10/28/2020 1737    CMP Latest Ref Rng & Units 10/29/2020 10/29/2020 10/29/2020  Glucose  70 - 99 mg/dL 198(H) 234(H) 234(H)  BUN 8 - 23 mg/dL _1 Creatinine 0.44 - 1.00 mg/dL 0.47 0.44 0.71  Sodium 135 - 145 mmol/L 145 142 142  Potassium 3.5 - 5.1 mmol/L 3.1(L) 2.9(L) 3.1(L)  Chloride 98 - 111 mmol/L 103 104 101  CO2 22 - 32 mmol/L _2 Calcium 8.9 - 10.3 mg/dL 9.3 8.7(L) 8.9  Total Protein 6.5 - 8.1 g/dL - - -  Total Bilirubin 0.3 - 1.2 mg/dL - - -  Alkaline Phos 38 - 126 U/L - - -  AST 15 - 41 U/L - - -  ALT 0 - 44 U/L - - -  Microbiology: Recent Results (from the past 240 hour(s))  SARS Coronavirus 2 by RT PCR (hospital order, performed in [facility] hospital lab) Nasopharyngeal Nasopharyngeal Swab     Status: None   Collection Time: 10/28/20  6:44 PM   Specimen: Nasopharyngeal Swab  Result Value Ref Range Status   SARS Coronavirus 2 NEGATIVE NEGATIVE Final    Comment: (NOTE) SARS-CoV-2 target nucleic acids are NOT DETECTED.  The SARS-CoV-2 RNA is generally detectable in upper and lower respiratory specimens during the acute phase of infection. The lowest concentration of SARS-CoV-2 viral copies this assay can detect is 250 copies / mL. A negative result does not preclude SARS-CoV-2 infection and should not be used as the sole basis for treatment or other patient management decisions.  A negative result may occur with improper specimen collection / handling, submission of specimen other than nasopharyngeal swab, presence of viral mutation(s) within the areas targeted by this assay, and inadequate number of viral copies (<250 copies / mL). A negative result must be combined with clinical observations, patient history, and epidemiological information.  Fact Sheet for Patients:   BoilerBrush.com.cy  Fact Sheet for Healthcare Providers: https://pope.com/  This test is not yet approved or  cleared by the [place] FDA and has been authorized for detection and/or diagnosis of SARS-CoV-2 by FDA  under an Emergency Use Authorization (EUA).  This EUA will remain in effect (meaning this test can be used) for the duration of the COVID-19 declaration under Section 564(b)(1) of the Act, 21 U.S.C. section 360bbb-3(b)(1), unless the authorization is terminated or revoked sooner.  Performed at [facility], [address] [place], [place] [address]   Blood culture (single)     Status: None (Preliminary result)   Collection Time: 10/28/20  7:26 PM   Specimen: BLOOD  Result Value Ref Range Status   Specimen Description BLOOD RIGHT ANTECUBITAL  Final   Special Requests   Final    BOTTLES DRAWN AEROBIC AND ANAEROBIC Blood Culture adequate volume   Culture  Setup Time   Final    GRAM POSITIVE COCCI IN BOTH AEROBIC AND ANAEROBIC BOTTLES Organism ID to follow CRITICAL RESULT CALLED TO, READ BACK BY AND VERIFIED WITH: S.HALLAJI,PHARMD AT 1008 ON 10/29/20 BY GM Performed at [facility], [address] [place], [place] [address]    Culture GRAM POSITIVE COCCI  Final   Report Status PENDING  Incomplete  Blood Culture ID Panel (Reflexed)     Status: Abnormal   Collection Time: 10/28/20  7:26 PM  Result Value Ref Range Status   Enterococcus faecalis NOT DETECTED NOT DETECTED Final   Enterococcus Faecium NOT DETECTED NOT DETECTED Final   Listeria monocytogenes NOT DETECTED NOT DETECTED Final   Staphylococcus species NOT DETECTED NOT DETECTED Final   Staphylococcus aureus (BCID) NOT DETECTED NOT DETECTED Final   Staphylococcus epidermidis NOT DETECTED NOT DETECTED Final   Staphylococcus lugdunensis NOT DETECTED NOT DETECTED Final   Streptococcus species DETECTED (A) NOT DETECTED Final    Comment: Not Enterococcus species, Streptococcus agalactiae, Streptococcus pyogenes, or Streptococcus pneumoniae. CRITICAL RESULT CALLED TO, READ BACK BY AND VERIFIED WITH: S.HALLAJI,PHARMD AT 1008 ON 10/29/20 BY GM    Streptococcus agalactiae NOT DETECTED NOT DETECTED Final    Streptococcus pneumoniae NOT DETECTED NOT DETECTED Final   Streptococcus pyogenes NOT DETECTED NOT DETECTED Final   A.calcoaceticus-baumannii NOT DETECTED NOT DETECTED Final   Bacteroides fragilis NOT DETECTED NOT DETECTED Final   Enterobacterales NOT DETECTED NOT DETECTED Final   Enterobacter  cloacae complex NOT DETECTED NOT DETECTED Final   Escherichia coli NOT DETECTED NOT DETECTED Final   Klebsiella aerogenes NOT DETECTED NOT DETECTED Final   Klebsiella oxytoca NOT DETECTED NOT DETECTED Final   Klebsiella pneumoniae NOT DETECTED NOT DETECTED Final   Proteus species NOT DETECTED NOT DETECTED Final   Salmonella species NOT DETECTED NOT DETECTED Final   Serratia marcescens NOT DETECTED NOT DETECTED Final   Haemophilus influenzae NOT DETECTED NOT DETECTED Final   Neisseria meningitidis NOT DETECTED NOT DETECTED Final   Pseudomonas aeruginosa NOT DETECTED NOT DETECTED Final   Stenotrophomonas maltophilia NOT DETECTED NOT DETECTED Final   Candida albicans NOT DETECTED NOT DETECTED Final   Candida auris NOT DETECTED NOT DETECTED Final   Candida glabrata NOT DETECTED NOT DETECTED Final   Candida krusei NOT DETECTED NOT DETECTED Final   Candida parapsilosis NOT DETECTED NOT DETECTED Final   Candida tropicalis NOT DETECTED NOT DETECTED Final   Cryptococcus neoformans/gattii NOT DETECTED NOT DETECTED Final    Comment: Performed at [facility], [address] [place], [place] [address]  Culture, blood (single)     Status: None (Preliminary result)   Collection Time: 10/28/20  8:43 PM   Specimen: BLOOD  Result Value Ref Range Status   Specimen Description BLOOD BLOOD RIGHT WRIST  Final   Special Requests   Final    BOTTLES DRAWN AEROBIC AND ANAEROBIC Blood Culture adequate volume   Culture   Final    NO GROWTH < 12 HOURS Performed at [facility], [address] [place], [place] [address]    Report Status PENDING  Incomplete    IMAGING RESULTS:  I have  personally reviewed the films Diffuse enlargement of the uterus with peripheral enhancement and central decreased attenuation/necrosis consistent with endometrial neoplasm till proven otherwise. This extends into the cervical canal and likely into the vaginal vault. Associated left iliac adenopathy is noted of a similar appearance with central necrosis?   Impression/Recommendation ? ?Necrotic uterine tumor with leucocytosis  Strep bacteremia- source very likely genitourinary tract Currently on ceftriaxone + flagyl. Change to unasyn Will be getting gyn procedure tomorrow   DKA- presented with DKA- no known diabetes- management as per primary team   ? ___________________________________________________ Discussed with patient,and daughter Note:  This document was prepared using Dragon voice recognition software and may include unintentional dictation errors.

## 2020-10-29 NOTE — Consult Note (Signed)
Consult History and Physical   SERVICE: Gynecology- [facility]   Patient Name: Mindy Olson Patient MRN:   [id number]  CC: admitted yesterday in ED for DKA . N/V   HPI: ROSABELL Mindy Olson is a 65 y.o. No obstetric history on file.Admission 10/29/19 for DKA . In ED because rooming issue . CT scan yesterday showed an enlarged uterus with extension into cx concerning for endometrial cancer   Pt states that she has had PMB for a long time . No Gyn care  No FHX for gyn cancer   Review of Systems: positives in bold GEN:   fevers, chills, weight changes, appetite changes, fatigue, night sweats HEENT:  HA, vision changes, hearing loss, congestion, rhinorrhea, sinus pressure, dysphagia CV:   CP, palpitations PULM:  SOB, cough GI:  abd pain, N/V/D/C GU:  dysuria, urgency, frequency, + PMB  MSK:  arthralgias, myalgias, back pain, swelling SKIN:  rashes, color changes, pallor NEURO:  numbness, weakness, tingling, seizures, dizziness, tremors PSYCH:  depression, anxiety, behavioral problems, confusion  HEME/LYMPH:  easy bruising or bleeding ENDO:  heat/cold intolerance  Past Obstetrical History: OB History   No obstetric history on file.   G1P1 c/s x1   Past Gynecologic History:  long h/o of PMB   Past Medical History: History reviewed. No pertinent past medical history.  Past Surgical History:  History reviewed. No pertinent surgical history.  Family History:  family history is not on file.  Social History:  Social History   Socioeconomic History  . Marital status: Married    Spouse name: Not on file  . Number of children: Not on file  . Years of education: Not on file  . Highest education level: Not on file  Occupational History  . Not on file  Tobacco Use  . Smoking status: Never Smoker  . Smokeless tobacco: Never Used  Substance and Sexual Activity  . Alcohol use: Not Currently  . Drug use: Not Currently  . Sexual activity: Not on file  Other  Topics Concern  . Not on file  Social History Narrative  . Not on file   Social Determinants of Health   Financial Resource Strain: Not on file  Food Insecurity: Not on file  Transportation Needs: Not on file  Physical Activity: Not on file  Stress: Not on file  Social Connections: Not on file  Intimate Partner Violence: Not on file    Home Medications:  Medications reconciled in EPIC  No current facility-administered medications on file prior to encounter.   No current outpatient medications on file prior to encounter.    Allergies:  No Known Allergies  Physical Exam:  Temp:  [97.6 F (36.4 C)-97.7 F (36.5 C)] 97.6 F (36.4 C) (01/30 1835) Pulse Rate:  [94-144] 104 (01/31 0700) Resp:  [14-33] 15 (01/31 0700) BP: (90-141)/(48-84) 117/62 (01/31 0700) SpO2:  [97 %-100 %] 98 % (01/31 0700) Weight:  [50.8 kg] 50.8 kg (01/30 1733)   General Appearance:  Well developed, well nourished, no acute distress, alert and oriented x3 HEENT:  Normocephalic atraumatic, extraocular movements intact, moist mucous membranes Cardiovascular:  Normal S1/S2, regular rate and rhythm, no murmurs Pulmonary:  clear to auscultation, no wheezes, rales or rhonchi, symmetric air entry, good air exchange Abdomen:  Bowel sounds present, soft, nontender, nondistended, no abnormal masses, no epigastric pain Pelvic : bi manual : firm cervix  Irregular shape . ++ old blood  Extremities:  Full range of motion, no pedal edema, 2+ distal pulses, no  tenderness Skin:  normal coloration and turgor, no rashes, no suspicious skin lesions noted  Neurologic:  Cranial nerves 2-12 grossly intact, normal muscle tone, strength 5/5 all four extremities Psychiatric:  Normal mood and affect, appropriate, no AH/VH Pelvic:   Labs/Studies:   CBC and Coags:  Lab Results  Component Value Date   WBC 39.8 (H) 10/28/2020   NEUTOPHILPCT 93 10/28/2020   EOSPCT 0 10/28/2020   BASOPCT 0 10/28/2020   LYMPHOPCT 2 10/28/2020    HGB 12.6 10/28/2020   HCT 40.6 10/28/2020   MCV 81.0 10/28/2020   PLT 544 (H) 10/28/2020   CMP:  Lab Results  Component Value Date   NA 142 10/29/2020   K 2.9 (L) 10/29/2020   CL 104 10/29/2020   CO2 29 10/29/2020   BUN 17 10/29/2020   CREATININE 0.44 10/29/2020   CREATININE 0.71 10/29/2020   CREATININE 0.90 10/28/2020   PROT 6.9 10/28/2020   BILITOT 3.2 (H) 10/28/2020   ALT 14 10/28/2020   AST 16 10/28/2020   ALKPHOS 217 (H) 10/28/2020    Other Imaging: CT ABDOMEN PELVIS W CONTRAST  Result Date: 10/28/2020 CLINICAL DATA:  Nausea and vomiting with weight loss for several months EXAM: CT ABDOMEN AND PELVIS WITH CONTRAST TECHNIQUE: Multidetector CT imaging of the abdomen and pelvis was performed using the standard protocol following bolus administration of intravenous contrast. CONTRAST:  66mL OMNIPAQUE IOHEXOL 300 MG/ML  SOLN COMPARISON:  None. FINDINGS: Lower chest: No acute abnormality. Hepatobiliary: Liver is well visualized and within normal limits. Gallbladder is not well seen and may have been surgically removed. Clinical correlation is recommended. Pancreas: Unremarkable. No pancreatic ductal dilatation or surrounding inflammatory changes. Spleen: Normal in size without focal abnormality. Adrenals/Urinary Tract: Adrenal glands are within normal limits. Kidneys demonstrate a normal enhancement pattern bilaterally. Fullness of the right renal collecting system is noted secondary to extrinsic compression by the prominent uterus. Bladder is somewhat distorted secondary to the enlarged uterus but is otherwise well distended. Stomach/Bowel: Mild diverticular change of the colon is noted without evidence of diverticulitis. The appendix is within normal limits. No small bowel abnormality is seen. Small sliding-type hiatal hernia is noted. Vascular/Lymphatic: Abdominal aorta shows no aneurysmal dilatation. Prominent centrally necrotic left iliac lymph node is noted measuring 2.2 cm in short  axis. No other sizable adenopathy is noted. Reproductive: The uterus is significantly enlarged measuring 8.7 by 9.9 cm in greatest AP and transverse dimensions respectively. It is elongated and diffusely decreased in attenuation with peripheral enhancement highly suggestive of endometrial neoplasm. These changes extend into the cervical canal and may extend into the vaginal vault on the sagittal images. No definitive adnexal mass is noted. Other: No free fluid is seen. No definitive endometrial deposits are noted at this time. Postsurgical changes in the anterior abdominal wall are seen. Musculoskeletal: No acute or significant osseous findings. 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 This extends into the cervical canal and likely into the vaginal vault. Associated left iliac adenopathy is noted of a similar appearance with central necrosis. Given its diffuse distribution throughout the entire uterus, this is not felt to represent fibroid change. Ultrasound is not likely to be helpful as a diagnostic tool. Direct visualization is recommended. Tissue sampling is also recommended. Surgical consultation is also recommended. Diverticulosis without diverticulitis Prominence of the right renal collecting system and proximal right ureter secondary to extrinsic compression by the enlarged uterus. No true obstructing  lesion is noted within the ureter. Electronically Signed   By: Inez Catalina M.D.   On: 10/28/2020 21:56   DG Chest Portable 1 View  Result Date: 10/28/2020 CLINICAL DATA:  Shortness of breath EXAM: PORTABLE CHEST 1 VIEW COMPARISON:  None. FINDINGS: The heart size and mediastinal contours are within normal limits. Both lungs are clear. The visualized skeletal structures are unremarkable. IMPRESSION: No active disease. Electronically Signed   By: Inez Catalina M.D.   On:  10/28/2020 19:40     Assessment / Plan:   ASHLON LOTTMAN is a 65 y.o. DKA and enlarged uterus on CTSCAn . A long h/o of PMB . Bimanual exam is c/w endometrial / cervical cancer  Once clinically stable she will need an EUA  And cervical and endometrial sampling . Will post for 10/30/20 I have consented the patient for surgery and risks .   Thank you for the opportunity to be involved with this pt's care.   Laverta Baltimore , MD  OB/[facility]n [facility]/ [facility]

## 2020-10-30 ENCOUNTER — Encounter: Payer: Self-pay | Admitting: Family Medicine

## 2020-10-30 ENCOUNTER — Inpatient Hospital Stay: Payer: Medicare Other | Admitting: Anesthesiology

## 2020-10-30 ENCOUNTER — Encounter
Admission: EM | Disposition: A | Discharge status: Home Health | Payer: Self-pay | Source: Home / Self Care | Attending: Family Medicine

## 2020-10-30 DIAGNOSIS — N938 Other specified abnormal uterine and vaginal bleeding: Secondary | ICD-10-CM

## 2020-10-30 HISTORY — PX: DILATATION & CURETTAGE/HYSTEROSCOPY WITH MYOSURE: SHX6511

## 2020-10-30 LAB — CBC
HCT: 34 % — ABNORMAL LOW (ref 36.0–46.0)
Hemoglobin: 10.7 g/dL — ABNORMAL LOW (ref 12.0–15.0)
MCH: 25 pg — ABNORMAL LOW (ref 26.0–34.0)
MCHC: 31.5 g/dL (ref 30.0–36.0)
MCV: 79.4 fL — ABNORMAL LOW (ref 80.0–100.0)
Platelets: 311 10*3/uL (ref 150–400)
RBC: 4.28 MIL/uL (ref 3.87–5.11)
RDW: 15.8 % — ABNORMAL HIGH (ref 11.5–15.5)
WBC: 19.9 10*3/uL — ABNORMAL HIGH (ref 4.0–10.5)
nRBC: 0 % (ref 0.0–0.2)

## 2020-10-30 LAB — COMPREHENSIVE METABOLIC PANEL
ALT: 12 U/L (ref 0–44)
AST: 17 U/L (ref 15–41)
Albumin: 2 g/dL — ABNORMAL LOW (ref 3.5–5.0)
Alkaline Phosphatase: 130 U/L — ABNORMAL HIGH (ref 38–126)
Anion gap: 14 (ref 5–15)
BUN: 11 mg/dL (ref 8–23)
CO2: 26 mmol/L (ref 22–32)
Calcium: 8.6 mg/dL — ABNORMAL LOW (ref 8.9–10.3)
Chloride: 103 mmol/L (ref 98–111)
Creatinine, Ser: 0.39 mg/dL — ABNORMAL LOW (ref 0.44–1.00)
GFR, Estimated: >60 mL/min (ref >60)
Glucose, Bld: 229 mg/dL — ABNORMAL HIGH (ref 70–99)
Potassium: 3.8 mmol/L (ref 3.5–5.1)
Sodium: 143 mmol/L (ref 135–145)
Total Bilirubin: 0.9 mg/dL (ref 0.3–1.2)
Total Protein: 5.2 g/dL — ABNORMAL LOW (ref 6.5–8.1)

## 2020-10-30 LAB — GLUCOSE, CAPILLARY
Glucose-Capillary: 217 mg/dL — ABNORMAL HIGH (ref 70–99)
Glucose-Capillary: 240 mg/dL — ABNORMAL HIGH (ref 70–99)
Glucose-Capillary: 225 mg/dL — ABNORMAL HIGH (ref 70–99)
Glucose-Capillary: 221 mg/dL — ABNORMAL HIGH (ref 70–99)
Glucose-Capillary: 204 mg/dL — ABNORMAL HIGH (ref 70–99)

## 2020-10-30 LAB — CBG MONITORING, ED
Glucose-Capillary: 169 mg/dL — ABNORMAL HIGH (ref 70–99)
Glucose-Capillary: 206 mg/dL — ABNORMAL HIGH (ref 70–99)

## 2020-10-30 LAB — MAGNESIUM: Magnesium: 1.9 mg/dL (ref 1.7–2.4)

## 2020-10-30 LAB — PHOSPHORUS: Phosphorus: 1.6 mg/dL — ABNORMAL LOW (ref 2.5–4.6)

## 2020-10-30 SURGERY — DILATATION & CURETTAGE/HYSTEROSCOPY WITH MYOSURE
Anesthesia: General

## 2020-10-30 MED ORDER — INSULIN ASPART 100 UNIT/ML ~~LOC~~ SOLN
SUBCUTANEOUS | Status: AC
  Filled 2020-10-30: qty 1

## 2020-10-30 MED ORDER — FERRIC SUBSULFATE 259 MG/GM EX SOLN
CUTANEOUS | Status: AC
  Filled 2020-10-30: qty 8

## 2020-10-30 MED ORDER — PROPOFOL 10 MG/ML IV BOLUS
INTRAVENOUS | Status: AC
  Filled 2020-10-30: qty 20

## 2020-10-30 MED ORDER — ONDANSETRON HCL 4 MG/2ML IJ SOLN
INTRAMUSCULAR | Status: AC
  Filled 2020-10-30: qty 2

## 2020-10-30 MED ORDER — ENSURE ENLIVE PO LIQD
237.0000 mL | Freq: Three times a day (TID) | ORAL | Status: DC
  Administered 2020-10-30 – 2020-11-01 (×6): 237 mL via ORAL

## 2020-10-30 MED ORDER — SUCCINYLCHOLINE CHLORIDE 20 MG/ML IJ SOLN
INTRAMUSCULAR | Status: DC | PRN
  Administered 2020-10-30: 100 mg via INTRAVENOUS

## 2020-10-30 MED ORDER — LIDOCAINE HCL (PF) 2 % IJ SOLN
INTRAMUSCULAR | Status: AC
  Filled 2020-10-30: qty 5

## 2020-10-30 MED ORDER — FENTANYL CITRATE (PF) 100 MCG/2ML IJ SOLN
INTRAMUSCULAR | Status: AC
  Filled 2020-10-30: qty 2

## 2020-10-30 MED ORDER — EPHEDRINE SULFATE 50 MG/ML IJ SOLN
INTRAMUSCULAR | Status: DC | PRN
  Administered 2020-10-30: 2.5 mg via INTRAVENOUS

## 2020-10-30 MED ORDER — INSULIN ASPART 100 UNIT/ML ~~LOC~~ SOLN
4.0000 [IU] | Freq: Once | SUBCUTANEOUS | Status: AC
  Administered 2020-10-30: 4 [IU] via SUBCUTANEOUS

## 2020-10-30 MED ORDER — SILVER NITRATE-POT NITRATE 75-25 % EX MISC
CUTANEOUS | Status: DC | PRN
  Administered 2020-10-30: 2 via TOPICAL

## 2020-10-30 MED ORDER — FERRIC SUBSULFATE 259 MG/GM EX SOLN
CUTANEOUS | Status: DC | PRN
  Administered 2020-10-30: 1 via TOPICAL

## 2020-10-30 MED ORDER — WHITE PETROLATUM EX OINT
TOPICAL_OINTMENT | CUTANEOUS | Status: AC
  Filled 2020-10-30: qty 5

## 2020-10-30 MED ORDER — LIDOCAINE HCL (CARDIAC) PF 100 MG/5ML IV SOSY
PREFILLED_SYRINGE | INTRAVENOUS | Status: DC | PRN
  Administered 2020-10-30: 60 mg via INTRAVENOUS

## 2020-10-30 MED ORDER — FENTANYL CITRATE (PF) 100 MCG/2ML IJ SOLN
INTRAMUSCULAR | Status: DC | PRN
  Administered 2020-10-30: 25 ug via INTRAVENOUS

## 2020-10-30 MED ORDER — FENTANYL CITRATE (PF) 100 MCG/2ML IJ SOLN
INTRAMUSCULAR | Status: AC
  Administered 2020-10-30: 25 ug via INTRAVENOUS
  Filled 2020-10-30: qty 2

## 2020-10-30 MED ORDER — PROPOFOL 10 MG/ML IV BOLUS
INTRAVENOUS | Status: DC | PRN
  Administered 2020-10-30: 30 mg via INTRAVENOUS
  Administered 2020-10-30: 70 mg via INTRAVENOUS
  Administered 2020-10-30: 10 mg via INTRAVENOUS

## 2020-10-30 MED ORDER — INSULIN STARTER KIT- PEN NEEDLES (ENGLISH)
1.0000 | Freq: Once | Status: AC
  Administered 2020-10-30: 1
  Filled 2020-10-30: qty 1

## 2020-10-30 MED ORDER — POTASSIUM PHOSPHATES 15 MMOLE/5ML IV SOLN
30.0000 mmol | Freq: Once | INTRAVENOUS | Status: AC
  Administered 2020-10-30: 30 mmol via INTRAVENOUS
  Filled 2020-10-30: qty 10

## 2020-10-30 MED ORDER — ONDANSETRON HCL 4 MG/2ML IJ SOLN: 4.0000 mg | Freq: Once | INTRAMUSCULAR | Status: DC | PRN

## 2020-10-30 MED ORDER — ONDANSETRON HCL 4 MG/2ML IJ SOLN
INTRAMUSCULAR | Status: DC | PRN
  Administered 2020-10-30: 4 mg via INTRAVENOUS

## 2020-10-30 MED ORDER — LIVING WELL WITH DIABETES BOOK
Freq: Once | Status: AC
  Filled 2020-10-30: qty 1

## 2020-10-30 MED ORDER — FENTANYL CITRATE (PF) 100 MCG/2ML IJ SOLN
25.0000 ug | INTRAMUSCULAR | Status: DC | PRN
  Administered 2020-10-30: 25 ug via INTRAVENOUS

## 2020-10-30 SURGICAL SUPPLY — 23 items
BAG INFUSER PRESSURE 100CC (MISCELLANEOUS) ×2 IMPLANT
CANISTER SUCT 3000ML PPV (MISCELLANEOUS) ×2 IMPLANT
CATH ROBINSON RED A/P 16FR (CATHETERS) IMPLANT
COVER WAND RF STERILE (DRAPES) IMPLANT
DEVICE MYOSURE LITE (MISCELLANEOUS) IMPLANT
GLOVE SURG SYN 8.0 (GLOVE) ×2 IMPLANT
GOWN STRL REUS W/ TWL LRG LVL3 (GOWN DISPOSABLE) ×1 IMPLANT
GOWN STRL REUS W/ TWL XL LVL3 (GOWN DISPOSABLE) ×1 IMPLANT
GOWN STRL REUS W/TWL LRG LVL3 (GOWN DISPOSABLE) ×2
GOWN STRL REUS W/TWL XL LVL3 (GOWN DISPOSABLE) ×2
IV NS 1000ML (IV SOLUTION) ×2
IV NS 1000ML BAXH (IV SOLUTION) ×1 IMPLANT
KIT PROCEDURE FLUENT (KITS) ×2 IMPLANT
KIT TURNOVER CYSTO (KITS) ×2 IMPLANT
MANIFOLD NEPTUNE II (INSTRUMENTS) ×2 IMPLANT
PACK DNC HYST (MISCELLANEOUS) IMPLANT
PAD OB MATERNITY 4.3X12.25 (PERSONAL CARE ITEMS) ×2 IMPLANT
PAD PREP 24X41 OB/GYN DISP (PERSONAL CARE ITEMS) ×2 IMPLANT
SEAL ROD LENS SCOPE MYOSURE (ABLATOR) ×2 IMPLANT
SOL .9 NS 3000ML IRR  AL (IV SOLUTION) ×2
SOL .9 NS 3000ML IRR UROMATIC (IV SOLUTION) ×1 IMPLANT
TOWEL OR 17X26 4PK STRL BLUE (TOWEL DISPOSABLE) ×2 IMPLANT
TUBING CONNECTING 10 (TUBING) IMPLANT

## 2020-10-30 NOTE — Anesthesia Preprocedure Evaluation (Signed)
Anesthesia Evaluation  Patient identified by MRN, date of birth, ID band Patient awake    Reviewed: Allergy & Precautions, NPO status , Patient's Chart, lab work & pertinent test results  Airway Mallampati: II  TM Distance: >3 FB     Dental  (+) Chipped, Caps   Pulmonary neg pulmonary ROS,    Pulmonary exam normal        Cardiovascular Normal cardiovascular exam     Neuro/Psych negative neurological ROS  negative psych ROS   GI/Hepatic Neg liver ROS,   Endo/Other  diabetes  Renal/GU Renal disease  Female GU complaint     Musculoskeletal   Abdominal Normal abdominal exam  (+)   Peds negative pediatric ROS (+)  Hematology   Anesthesia Other Findings   Reproductive/Obstetrics                             Anesthesia Physical Anesthesia Plan  ASA: III  Anesthesia Plan: General   Post-op Pain Management:    Induction: Intravenous, Rapid sequence and Cricoid pressure planned  PONV Risk Score and Plan:   Airway Management Planned: Oral ETT  Additional Equipment:   Intra-op Plan:   Post-operative Plan: Extubation in OR  Informed Consent: I have reviewed the patients History and Physical, chart, labs and discussed the procedure including the risks, benefits and alternatives for the proposed anesthesia with the patient or authorized representative who has indicated his/her understanding and acceptance.     Dental advisory given  Plan Discussed with: CRNA and Surgeon  Anesthesia Plan Comments:         Anesthesia Quick Evaluation

## 2020-10-30 NOTE — Anesthesia Procedure Notes (Signed)
Procedure Name: Intubation Date/Time: 10/30/2020 12:23 PM Performed by: Tollie Eth, CRNA Pre-anesthesia Checklist: Patient identified, Patient being monitored, Timeout performed, Emergency Drugs available and Suction available Patient Re-evaluated:Patient Re-evaluated prior to induction Oxygen Delivery Method: Circle system utilized Preoxygenation: Pre-oxygenation with 100% oxygen Induction Type: IV induction and Rapid sequence Ventilation: Mask ventilation without difficulty Laryngoscope Size: Mac and 3 Grade View: Grade I Tube type: Oral Tube size: 7.0 mm Number of attempts: 1 Airway Equipment and Method: Stylet Placement Confirmation: ETT inserted through vocal cords under direct vision,  positive ETCO2 and breath sounds checked- equal and bilateral Secured at: 22 cm Tube secured with: Tape Dental Injury: Teeth and Oropharynx as per pre-operative assessment

## 2020-10-30 NOTE — Plan of Care (Signed)
  RD consulted for nutrition education regarding diabetes.   Lab Results  Component Value Date   HGBA1C 14.8 (H) 10/28/2020    RD provided "Carbohydrate Counting for People with Diabetes" handout from the Academy of Nutrition and Dietetics. Discussed different food groups and their effects on blood sugar, emphasizing carbohydrate-containing foods. Provided list of carbohydrates and recommended serving sizes of common foods.  Discussed importance of controlled and consistent carbohydrate intake throughout the day. Provided examples of ways to balance meals/snacks and encouraged intake of high-fiber, whole grain complex carbohydrates. Teach back method used.  Expect fair compliance. Patient reports unable to process DM information at this time. She would benefit from referral to outpatient counseling with RD once appropriate.  Body mass index is 19.22 kg/m.   Current diet order is carbohydrate modified. Diet was just advanced so patient had not yet had a meal at time of RD assessment.  Labs and medications reviewed. RD contact information provided. RD will continue to follow patient during admission in setting of malnutrition.  Jacklynn Barnacle, MS, RD, LDN Pager number available on Amion

## 2020-10-30 NOTE — Plan of Care (Signed)

## 2020-10-30 NOTE — Brief Op Note (Signed)
10/28/2020 - 10/30/2020  12:47 PM  PATIENT:  Mindy Olson  65 y.o. female  PRE-OPERATIVE DIAGNOSIS:  Bleeding, cervical / endometrial cancer   POST-OPERATIVE DIAGNOSIS:  Bleeding, exam c/w endometrial cancer  PROCEDURE:  Exam under anesthesia , cx biopsy , endometrial curettage SURGEON:  Surgeon(s) and Role:    * Saralynn Langhorst, Gwen Her, MD - Primary  PHYSICIAN ASSISTANT:   ASSISTANTS: none   ANESTHESIA:   general  EBL:  10 mL IOF 200 cc , ou 400 cc  BLOOD ADMINISTERED:none  DRAINS: none   LOCAL MEDICATIONS USED:  NONE  SPECIMEN:  Source of Specimen:  cervical biopsy , endometrial curettings   DISPOSITION OF SPECIMEN:  PATHOLOGY  COUNTS:  YES  TOURNIQUET:  * No tourniquets in log *  DICTATION: .Other Dictation: Dictation Number verbal  PLAN OF CARE: back to floor for hospitalist to manage and discharge   PATIENT DISPOSITION:  PACU - hemodynamically stable.   Delay start of Pharmacological VTE agent (>24hrs) due to surgical blood loss or risk of bleeding: not applicable

## 2020-10-30 NOTE — ED Notes (Signed)
RN rounded on pt. Purewick changed with new one. Pt cleaned from bloody discharge and new depends placed on pt.

## 2020-10-30 NOTE — Progress Notes (Signed)
PROGRESS NOTE    Mindy Olson  INO:[id number] DOB: 1956-03-08 DOA: 10/28/2020 PCP: System, Provider Not In   Brief Narrative:  This 65 years old female with unknown medical history who presents with C/o:  Nausea, vomiting,  diarrhea and generalized weakness.  Patient has not seen a provider in several years.  Patient also reports daily heavy abnormal uterine bleeding and has not been evaluated.  In the ED she is found to have diabetic ketoacidosis and UTI.  CT abdomen showed diffuse enlargement of the uterus with necrosis consistent with endometrial neoplasm. Patient is admitted for DKA and was started on insulin drip.  Anion gap closed. DKA has resolved.  Patient is transitioned to long-acting and short-acting insulin.  Patient is started on IV cefepime and Flagyl for sepsis secondary to UTI.  OB/GYN consulted.  Patient underwent endoscopic ultrasound and endometrial biopsy, findings consistent with endometrial cancer.  Assessment & Plan:   Principal Problem:   DKA (diabetic ketoacidosis) ([place]) Active Problems:   Sepsis secondary to UTI ([place])   AKI (acute kidney injury) ([place])   Abnormal uterine and vaginal bleeding, unspecified   Elevated bilirubin   Severe sepsis secondary to UTI:   Patient presented with tachycardia, significant leukocytosis and lactate of 2.3 with positive UA She has initially received empiric cefepime and Flagyl in the ED. Antibiotic transitioned to ceftriaxone 2 g daily. Blood cultures positive for streptococci.. Infectious disease consulted for antibiotic recommendations. Lactic acid normalized with IV fluids. Antibiotics changed to Unasyn. ID has to decide about the duration of antibiotics.  DKA with newly diagnosed diabetes >> Improved Patient presented with glucose of 624 with anion gap of 39.  pH of 7.27, bicarb of 13.  Beta hydroxybutyrate greater than 8 Likely triggered by UTI.  Hemoglobin A1c 14.8 (uncontrolled)  -replete potassium as needed -  continue insulin gtt with goal of 140-180 and AG <12 - IV NS until BG <250, then switch to D5 1/2 NS  - BMP q4hr  - keep NPO  -Anion gap closed.  Patient has been transitioned to subcu insulin.  AKI >>> Resolved. pre-renal follow with repeat BMP while getting IV fluids  Postmenopausal abnormal uterine bleeding Patient has had symptoms for several years and has never seek evaluation Concerning for endometrial neoplasm GYN consulted.  Patient underwent endoscopic ultrasound with possible biopsy. Patient is well optimized for procedure. Hgb stable. Patient has outpatient appointment with OB/GYN.  Elevated bilirubin Elevated bilirubin of 3.2.  Possibly reactive due to DKA.  Patient has history of cholecystectomy.   DVT prophylaxis: SCDs Code Status: Full code Family Communication: No family at bedside Disposition Plan:  Status is: Inpatient  Remains inpatient appropriate because:Inpatient level of care appropriate due to severity of illness   Dispo: The patient is from: Home              Anticipated d/c is to: Home with Home services              Anticipated d/c date is: 1-2 days              Patient currently is not medically stable to d/c.   Difficult to place patient No   Consultants:   OB/GYN  Infectious disease  Procedures:  Antimicrobials:  Anti-infectives (From admission, onward)   Start     Dose/Rate Route Frequency Ordered Stop   10/30/20 1200  Ampicillin-Sulbactam (UNASYN) 3 g in sodium chloride 0.9 % 100 mL IVPB        3 g  200 mL/hr over 30 Minutes Intravenous Every 6 hours 10/29/20 1702     10/30/20 1000  cefTRIAXone (ROCEPHIN) 2 g in sodium chloride 0.9 % 100 mL IVPB  Status:  Discontinued        2 g 200 mL/hr over 30 Minutes Intravenous Every 24 hours 10/29/20 1046 10/29/20 1702   10/30/20 0000  cefTRIAXone (ROCEPHIN) 1 g in sodium chloride 0.9 % 100 mL IVPB  Status:  Discontinued        1 g 200 mL/hr over 30 Minutes Intravenous Every 24 hours  10/29/20 0014 10/29/20 1046   10/29/20 1830  metroNIDAZOLE (FLAGYL) IVPB 500 mg        500 mg 100 mL/hr over 60 Minutes Intravenous Every 8 hours 10/29/20 1702 10/30/20 0738   10/29/20 1800  cefTRIAXone (ROCEPHIN) 2 g in sodium chloride 0.9 % 100 mL IVPB        2 g 200 mL/hr over 30 Minutes Intravenous  Once 10/29/20 1702 10/29/20 1834   10/28/20 1930  ceFEPIme (MAXIPIME) 2 g in sodium chloride 0.9 % 100 mL IVPB        2 g 200 mL/hr over 30 Minutes Intravenous  Once 10/28/20 1919 10/28/20 2102   10/28/20 1930  metroNIDAZOLE (FLAGYL) IVPB 500 mg        500 mg 100 mL/hr over 60 Minutes Intravenous  Once 10/28/20 1919 10/28/20 2207      Subjective: Patient was seen and examined at bedside.  Overnight events noted.   Patient reports feeling generally weak,  denies any dizziness, chest pain. She underwent endometrial biopsy ,   tolerated well.  Objective: Vitals:   10/30/20 1331 10/30/20 1345 10/30/20 1400 10/30/20 1431  BP: 115/85 133/67 127/61 137/75  Pulse: 85 80 83 85  Resp: _0 Temp:   97.8 F (36.6 C) 97.6 F (36.4 C)  TempSrc:      SpO2: 99% 100% 100% 100%  Weight:      Height:        Intake/Output Summary (Last 24 hours) at 10/30/2020 1456 Last data filed at 10/30/2020 1259 Gross per 24 hour  Intake 2300 ml  Output 710 ml  Net 1590 ml   Filed Weights   10/28/20 1733  Weight: 50.8 kg    Examination:  General exam: Appears calm and comfortable, not in any acute distress. Respiratory system: Clear to auscultation. Respiratory effort normal. Cardiovascular system: S1 & S2 heard, RRR. No JVD, murmurs, rubs, gallops or clicks. No pedal edema. Gastrointestinal system: Abdomen is nondistended, soft and nontender. No organomegaly or masses felt. Normal bowel sounds heard. Central nervous system: Alert and oriented. No focal neurological deficits. Extremities: Symmetric 5 x 5 power. Skin: No rashes, lesions or ulcers Psychiatry: Judgement and insight appear  normal. Mood & affect appropriate.     Data Reviewed: I have personally reviewed following labs and imaging studies  CBC: Recent Labs  Lab 10/28/20 1737 10/29/20 1219 10/30/20 0553  WBC 39.8*  --  19.9*  NEUTROABS 36.7*  --   --   HGB 12.6 11.1* 10.7*  HCT 40.6 35.2* 34.0*  MCV 81.0  --  79.4*  PLT 544*  --  748   Basic Metabolic Panel: Recent Labs  Lab 10/29/20 0314 10/29/20 0841 10/29/20 1219 10/29/20 1500 10/30/20 0553  NA 142 142 145 144 143  K 3.1* 2.9* 3.1* 3.7 3.8  CL 101 104 103 105 103  CO2 _1 GLUCOSE 234* 234* 198*  192* 229*  BUN _0 CREATININE 0.71 0.44 0.47 0.39* 0.39*  CALCIUM 8.9 8.7* 9.3 8.7* 8.6*  MG  --   --   --   --  1.9  PHOS  --   --   --   --  1.6*   GFR: Estimated Creatinine Clearance: 56.2 mL/min (A) (by C-G formula based on SCr of 0.39 mg/dL (L)). Liver Function Tests: Recent Labs  Lab 10/28/20 1737 10/30/20 0553  AST 16 17  ALT 14 12  ALKPHOS 217* 130*  BILITOT 3.2* 0.9  PROT 6.9 5.2*  ALBUMIN 2.7* 2.0*   No results for input(s): LIPASE, AMYLASE in the last 168 hours. No results for input(s): AMMONIA in the last 168 hours. Coagulation Profile: No results for input(s): INR, PROTIME in the last 168 hours. Cardiac Enzymes: No results for input(s): CKTOTAL, CKMB, CKMBINDEX, TROPONINI in the last 168 hours. BNP (last 3 results) No results for input(s): PROBNP in the last 8760 hours. HbA1C: Recent Labs    10/28/20 2349  HGBA1C 14.8*   CBG: Recent Labs  Lab 10/29/20 2347 10/30/20 0740 10/30/20 0935 10/30/20 1138 10/30/20 1305  GLUCAP 197* 206* 217* 240* 225*   Lipid Profile: No results for input(s): CHOL, HDL, LDLCALC, TRIG, CHOLHDL, LDLDIRECT in the last 72 hours. Thyroid Function Tests: No results for input(s): TSH, T4TOTAL, FREET4, T3FREE, THYROIDAB in the last 72 hours. Anemia Panel: No results for input(s): VITAMINB12, FOLATE, FERRITIN, TIBC, IRON, RETICCTPCT in the last 72 hours. Sepsis  Labs: Recent Labs  Lab 10/28/20 1834 10/28/20 2043 10/29/20 0116 10/29/20 0314  LATICACIDVEN 2.2* 2.3* 2.1* 1.7    Recent Results (from the past 240 hour(s))  SARS Coronavirus 2 by RT PCR (hospital order, performed in [facility] hospital lab) Nasopharyngeal Nasopharyngeal Swab     Status: None   Collection Time: 10/28/20  6:44 PM   Specimen: Nasopharyngeal Swab  Result Value Ref Range Status   SARS Coronavirus 2 NEGATIVE NEGATIVE Final    Comment: (NOTE) SARS-CoV-2 target nucleic acids are NOT DETECTED.  The SARS-CoV-2 RNA is generally detectable in upper and lower respiratory specimens during the acute phase of infection. The lowest concentration of SARS-CoV-2 viral copies this assay can detect is 250 copies / mL. A negative result does not preclude SARS-CoV-2 infection and should not be used as the sole basis for treatment or other patient management decisions.  A negative result may occur with improper specimen collection / handling, submission of specimen other than nasopharyngeal swab, presence of viral mutation(s) within the areas targeted by this assay, and inadequate number of viral copies (<250 copies / mL). A negative result must be combined with clinical observations, patient history, and epidemiological information.  Fact Sheet for Patients:   StrictlyIdeas.no  Fact Sheet for Healthcare Providers: BankingDealers.co.za  This test is not yet approved or  cleared by the [place] FDA and has been authorized for detection and/or diagnosis of SARS-CoV-2 by FDA under an Emergency Use Authorization (EUA).  This EUA will remain in effect (meaning this test can be used) for the duration of the COVID-19 declaration under Section 564(b)(1) of the Act, 21 U.S.C. section 360bbb-3(b)(1), unless the authorization is terminated or revoked sooner.  Performed at [facility], [address] [place], [place]  [address]   Blood culture (single)     Status: None (Preliminary result)   Collection Time: 10/28/20  7:26 PM   Specimen: BLOOD  Result Value Ref Range Status  Specimen Description   Final    BLOOD RIGHT ANTECUBITAL Performed at [facility], [address] [place], [place] [address]    Special Requests   Final    BOTTLES DRAWN AEROBIC AND ANAEROBIC Blood Culture adequate volume Performed at [facility], [address] [place], [place] [address]    Culture  Setup Time   Final    GRAM POSITIVE COCCI IN BOTH AEROBIC AND ANAEROBIC BOTTLES CRITICAL RESULT CALLED TO, READ BACK BY AND VERIFIED WITH: S.HALLAJI,PHARMD AT 1008 ON 10/29/20 BY GM Performed at [facility], [place] [address], [place], [place] [address]    Culture GRAM POSITIVE COCCI  Final   Report Status PENDING  Incomplete  Blood Culture ID Panel (Reflexed)     Status: Abnormal   Collection Time: 10/28/20  7:26 PM  Result Value Ref Range Status   Enterococcus faecalis NOT DETECTED NOT DETECTED Final   Enterococcus Faecium NOT DETECTED NOT DETECTED Final   Listeria monocytogenes NOT DETECTED NOT DETECTED Final   Staphylococcus species NOT DETECTED NOT DETECTED Final   Staphylococcus aureus (BCID) NOT DETECTED NOT DETECTED Final   Staphylococcus epidermidis NOT DETECTED NOT DETECTED Final   Staphylococcus lugdunensis NOT DETECTED NOT DETECTED Final   Streptococcus species DETECTED (A) NOT DETECTED Final    Comment: Not Enterococcus species, Streptococcus agalactiae, Streptococcus pyogenes, or Streptococcus pneumoniae. CRITICAL RESULT CALLED TO, READ BACK BY AND VERIFIED WITH: S.HALLAJI,PHARMD AT 1008 ON 10/29/20 BY GM    Streptococcus agalactiae NOT DETECTED NOT DETECTED Final   Streptococcus pneumoniae NOT DETECTED NOT DETECTED Final   Streptococcus pyogenes NOT DETECTED NOT DETECTED Final   A.calcoaceticus-baumannii NOT DETECTED NOT DETECTED Final   Bacteroides fragilis NOT DETECTED NOT DETECTED  Final   Enterobacterales NOT DETECTED NOT DETECTED Final   Enterobacter cloacae complex NOT DETECTED NOT DETECTED Final   Escherichia coli NOT DETECTED NOT DETECTED Final   Klebsiella aerogenes NOT DETECTED NOT DETECTED Final   Klebsiella oxytoca NOT DETECTED NOT DETECTED Final   Klebsiella pneumoniae NOT DETECTED NOT DETECTED Final   Proteus species NOT DETECTED NOT DETECTED Final   Salmonella species NOT DETECTED NOT DETECTED Final   Serratia marcescens NOT DETECTED NOT DETECTED Final   Haemophilus influenzae NOT DETECTED NOT DETECTED Final   Neisseria meningitidis NOT DETECTED NOT DETECTED Final   Pseudomonas aeruginosa NOT DETECTED NOT DETECTED Final   Stenotrophomonas maltophilia NOT DETECTED NOT DETECTED Final   Candida albicans NOT DETECTED NOT DETECTED Final   Candida auris NOT DETECTED NOT DETECTED Final   Candida glabrata NOT DETECTED NOT DETECTED Final   Candida krusei NOT DETECTED NOT DETECTED Final   Candida parapsilosis NOT DETECTED NOT DETECTED Final   Candida tropicalis NOT DETECTED NOT DETECTED Final   Cryptococcus neoformans/gattii NOT DETECTED NOT DETECTED Final    Comment: Performed at [facility], [address] [place], [place] [address]  Culture, blood (single)     Status: None (Preliminary result)   Collection Time: 10/28/20  8:43 PM   Specimen: BLOOD  Result Value Ref Range Status   Specimen Description BLOOD BLOOD RIGHT WRIST  Final   Special Requests   Final    BOTTLES DRAWN AEROBIC AND ANAEROBIC Blood Culture adequate volume   Culture   Final    NO GROWTH 2 DAYS Performed at [facility], [address], [place], [place] [address]    Report Status PENDING  Incomplete         Radiology Studies: CT ABDOMEN PELVIS  W CONTRAST  Result Date: 10/28/2020 CLINICAL DATA:  Nausea and vomiting with weight loss for several months EXAM: CT ABDOMEN AND PELVIS WITH CONTRAST TECHNIQUE: Multidetector CT imaging of the abdomen and pelvis was  performed using the standard protocol following bolus administration of intravenous contrast. CONTRAST:  51m OMNIPAQUE IOHEXOL 300 MG/ML  SOLN COMPARISON:  None. FINDINGS: Lower chest: No acute abnormality. Hepatobiliary: Liver is well visualized and within normal limits. Gallbladder is not well seen and may have been surgically removed. Clinical correlation is recommended. Pancreas: Unremarkable. No pancreatic ductal dilatation or surrounding inflammatory changes. Spleen: Normal in size without focal abnormality. Adrenals/Urinary Tract: Adrenal glands are within normal limits. Kidneys demonstrate a normal enhancement pattern bilaterally. Fullness of the right renal collecting system is noted secondary to extrinsic compression by the prominent uterus. Bladder is somewhat distorted secondary to the enlarged uterus but is otherwise well distended. Stomach/Bowel: Mild diverticular change of the colon is noted without evidence of diverticulitis. The appendix is within normal limits. No small bowel abnormality is seen. Small sliding-type hiatal hernia is noted. Vascular/Lymphatic: Abdominal aorta shows no aneurysmal dilatation. Prominent centrally necrotic left iliac lymph node is noted measuring 2.2 cm in short axis. No other sizable adenopathy is noted. Reproductive: The uterus is significantly enlarged measuring 8.7 by 9.9 cm in greatest AP and transverse dimensions respectively. It is elongated and diffusely decreased in attenuation with peripheral enhancement highly suggestive of endometrial neoplasm. These changes extend into the cervical canal and may extend into the vaginal vault on the sagittal images. No definitive adnexal mass is noted. Other: No free fluid is seen. No definitive endometrial deposits are noted at this time. Postsurgical changes in the anterior abdominal wall are seen. Musculoskeletal: No acute or significant osseous findings. IMPRESSION: Diffuse enlargement of the uterus with peripheral  enhancement and central decreased attenuation/necrosis consistent with endometrial neoplasm till proven otherwise. This extends into the cervical canal and likely into the vaginal vault. Associated left iliac adenopathy is noted of a similar appearance with central necrosis. Given its diffuse distribution throughout the entire uterus, this is not felt to represent fibroid change. Ultrasound is not likely to be helpful as a diagnostic tool. Direct visualization is recommended. Tissue sampling is also recommended. Surgical consultation is also recommended. Diverticulosis without diverticulitis Prominence of the right renal collecting system and proximal right ureter secondary to extrinsic compression by the enlarged uterus. No true obstructing lesion is noted within the ureter. Electronically Signed   By: MInez CatalinaM.D.   On: 10/28/2020 21:56   DG Chest Portable 1 View  Result Date: 10/28/2020 CLINICAL DATA:  Shortness of breath EXAM: PORTABLE CHEST 1 VIEW COMPARISON:  None. FINDINGS: The heart size and mediastinal contours are within normal limits. Both lungs are clear. The visualized skeletal structures are unremarkable. IMPRESSION: No active disease. Electronically Signed   By: MInez CatalinaM.D.   On: 10/28/2020 19:40   Scheduled Meds: . insulin aspart      . insulin aspart  0-15 Units Subcutaneous TID WC  . insulin glargine  10 Units Subcutaneous Daily  . insulin starter kit- pen needles  1 kit Other Once  . living well with diabetes book   Does not apply Once  . potassium chloride  40 mEq Oral Once  . white petrolatum       Continuous Infusions: . sodium chloride 0  (10/30/20 0609)  . ampicillin-sulbactam (UNASYN) IV    . dextrose 5% lactated ringers Stopped (10/30/20 0609)  . lactated ringers Stopped (  10/29/20 0700)  . potassium PHOSPHATE IVPB (in mmol) 30 mmol (10/30/20 1037)     LOS: 2 days    Time spent: 25 mins    Arma Reining, MD [facility]   If 7PM-7AM, please  contact night-coverage

## 2020-10-30 NOTE — Progress Notes (Signed)
Initial Nutrition Assessment  DOCUMENTATION CODES:   Severe malnutrition in context of chronic illness  INTERVENTION:  Provide Ensure Enlive po TID, each supplement provides 350 kcal and 20 grams of protein.  Pt would benefit from nutrient dense supplement. Given pt's diagnosis of DM, RD will reassess adequacy of PO intake, CBGS, and adjust supplement regimen as appropriate at follow-up.   Monitor magnesium, potassium, and phosphorus daily for at least 3 days, MD to replete as needed, as pt is at risk for refeeding syndrome.  Did provide diet education per consult. However, main focus of nutrition intervention at this time is improving intake and addressing malnutrition. Patient stated she is unable to process DM education at this time. She would likely benefit from outpatient DM counseling with RD after discharge from hospital.  NUTRITION DIAGNOSIS:   Severe Malnutrition related to chronic illness (possible cervical/endometrial cancer) as evidenced by moderate fat depletion,severe fat depletion,moderate muscle depletion,severe muscle depletion, 20% weight loss sometime in the past year.  GOAL:   Patient will meet greater than or equal to 90% of their needs  MONITOR:   PO intake,Supplement acceptance,Labs,Weight trends,I & O's  REASON FOR ASSESSMENT:   Malnutrition Screening Tool,Consult Diet education  ASSESSMENT:   65 year old female with unknown PMHx admitted with severe sepsis secondary to UTI, DKA with newly diagnosed DM, AKI, postemenopausal abnormal uterine bleeding concerning for cervical/endometrial cancer s/p endometrial curettage and biopsy 2/1.   Met with patient and daughter at bedside after she returned from OR. She reports her appetite is typically good but varies day by day and has been decreased lately. She reports she may eat anywhere from 2-4 meals per day and her intake varies so she is unable to describe exact typical intake. Discussed importance of adequate  intake. Patient reports her appetite is not good today and when diet is advanced she may only have some chicken broth. Discussed ways to increase calorie and protein intake once patient is feeling better. She is also amenable to drinking oral nutrition supplements to help meet calorie/protein needs. Also provided diet education regarding new diagnosis of DM but at this time focus of nutrition care is on improve intake and addressing patient's malnutrition. Patient stated she is not able to process DM education at this time.  Patient reports her UBW was 140-145 lbs. She is unsure exactly when she started losing weight but she feels it has been in the past year. She reports at home before coming to hospital she weighed 112 lbs. That is a reported weight loss of at least 28 lbs (20% body weight) sometime over the past year, which is significant for time frame.  Medications reviewed and include: Novolog 0-15 units TID, Lantus 10 unts daily, Unasyn.  Labs reviewed: CBG 217-240, Creatinine 0.39, Phosphorus 1.6.  NUTRITION - FOCUSED PHYSICAL EXAM:  Flowsheet Row Most Recent Value  Orbital Region Severe depletion  Upper Arm Region Moderate depletion  Thoracic and Lumbar Region Moderate depletion  Buccal Region Severe depletion  Temple Region Severe depletion  Clavicle Bone Region Severe depletion  Clavicle and Acromion Bone Region Moderate depletion  Scapular Bone Region Moderate depletion  Dorsal Hand Severe depletion  Patellar Region Severe depletion  Anterior Thigh Region Moderate depletion  Posterior Calf Region Severe depletion  Edema (RD Assessment) None  Hair Reviewed  Eyes Reviewed  Mouth Reviewed  Skin Reviewed  Nails Reviewed     Diet Order:   Diet Order  Diet Carb Modified Fluid consistency: Thin; Room service appropriate? Yes  Diet effective now                EDUCATION NEEDS:   Education needs have been addressed  Skin:  Skin Assessment: Skin Integrity  Issues: Skin Integrity Issues:: Incisions Incisions: closed incicion to vagina  Last BM:  Unknown  Height:   Ht Readings from Last 1 Encounters:  10/28/20 '5\' 4"'  (1.626 m)   Weight:   Wt Readings from Last 1 Encounters:  10/28/20 50.8 kg   Ideal Body Weight:  54.5 kg  BMI:  Body mass index is 19.22 kg/m.  Estimated Nutritional Needs:   Kcal:  1600-1800  Protein:  80-90 grams  Fluid:  1.6-1.8 L/day  Jacklynn Barnacle, MS, RD, LDN Pager number available on Amion

## 2020-10-30 NOTE — TOC Initial Note (Signed)
Transition of Care [facility] [place] [facility]) - Initial/Assessment Note    Patient Details  Name: Mindy Olson MRN: [id number] Date of Birth: 1955/12/06  [facility] CM/SW Contact:    Adelene Amas, [place] Phone Number: 10/30/2020, 8:49 AM  Clinical Narrative:  Patient currently lives alone and is able to care for herself and is able to complete all ADL's. Patient does not have a primary care provider. Patient is able to drive herself but recently stopped due to feeling fearful. Patient's main contact is her daughter Mindy Olson, [phone]. Patient states she does have [place] and has her card with her. Patient stated she is fully vaccinated for COVID-19.                    Expected Discharge Plan: [place] Barriers to Discharge: Continued Medical Work up,ED Medication assistance   Patient Goals and CMS Choice Patient states their goals for this hospitalization and ongoing recovery are:: Patient stated she would like to return home.      Expected Discharge Plan and Services Expected Discharge Plan: [place] In-house Referral: Clinical Social Work   Post Acute Care Choice: [place] arrangements for the past 2 months: [place]                                      Prior Living Arrangements/Services Living arrangements for the past 2 months: Single Family Home Lives with:: Self Patient language and need for interpreter reviewed:: Yes Do you feel safe going back to the place where you live?: Yes      Need for Family Participation in Patient Care: No (Comment) Care giver support system in place?: Yes (comment)   Criminal Activity/Legal Involvement Pertinent to Current Situation/Hospitalization: No - Comment as needed  Activities of Daily Living      Permission Sought/Granted Permission sought to share information with : Family Supports    Share Information with NAME: Mindy Olson, [phone]  (Daughter)           Emotional Assessment Appearance:: Developmentally appropriate Attitude/Demeanor/Rapport: Crying,Gracious Affect (typically observed): Accepting Orientation: : Oriented to Self,Oriented to Place,Oriented to  Time,Oriented to Situation Alcohol / Substance Use: Not Applicable Psych Involvement: No (comment)  Admission diagnosis:  Dehydration [E86.0] DKA (diabetic ketoacidosis) ([place]) [E11.10] Diabetic ketoacidosis without coma associated with type 2 diabetes mellitus ([place]) [E11.10] Leukocytosis, unspecified type [D72.829] Patient Active Problem List   Diagnosis Date Noted  . Sepsis secondary to UTI ([place]) 10/29/2020  . AKI (acute kidney injury) ([place]) 10/29/2020  . Abnormal uterine and vaginal bleeding, unspecified 10/29/2020  . Elevated bilirubin 10/29/2020  . DKA (diabetic ketoacidosis) ([place]) 10/28/2020   PCP:  System, Provider Not In Pharmacy:  No Pharmacies Listed    Social Determinants of Health (SDOH) Interventions    Readmission Risk Interventions No flowsheet data found.

## 2020-10-30 NOTE — Progress Notes (Signed)
Obstetric and Gynecology  Subjective  Mindy Olson is a 65 y.o. female No obstetric history on file. who presented on 10/28/2020 for pt with presumed cervical / endometrial cancer Scheduled for EUA and biopsies today . DKA improved .      Objective   Vitals:   10/30/20 0726 10/30/20 0900  BP: 134/75 140/79  Pulse: 95 87  Resp: 13 16  Temp:  98.5 F (36.9 C)  SpO2: 97% 99%     Intake/Output Summary (Last 24 hours) at 10/30/2020 1056 Last data filed at 10/30/2020 9937 Gross per 24 hour  Intake 2100 ml  Output 300 ml  Net 1800 ml    General: NAD Cardiovascular: RRR, no murmurs Pulmonary: CTAB Abdomen: Benign. Non-tender, +BS, no guarding. Extremities: No erythema or cords, no calf tenderness, +warmth with normal peripheral pulses.  Labs: Results for orders placed or performed during the hospital encounter of 10/28/20 (from the past 24 hour(s))  CBG monitoring, ED     Status: Abnormal   Collection Time: 10/29/20 11:46 AM  Result Value Ref Range   Glucose-Capillary 155 (H) 70 - 99 mg/dL  Basic metabolic panel     Status: Abnormal   Collection Time: 10/29/20 12:19 PM  Result Value Ref Range   Sodium 145 135 - 145 mmol/L   Potassium 3.1 (L) 3.5 - 5.1 mmol/L   Chloride 103 98 - 111 mmol/L   CO2 28 22 - 32 mmol/L   Glucose, Bld 198 (H) 70 - 99 mg/dL   BUN 16 8 - 23 mg/dL   Creatinine, Ser 0.47 0.44 - 1.00 mg/dL   Calcium 9.3 8.9 - 10.3 mg/dL   GFR, Estimated >60 >60 mL/min   Anion gap 14 5 - 15  Hemoglobin and hematocrit, blood     Status: Abnormal   Collection Time: 10/29/20 12:19 PM  Result Value Ref Range   Hemoglobin 11.1 (L) 12.0 - 15.0 g/dL   HCT 35.2 (L) 36.0 - 46.0 %  CBG monitoring, ED     Status: Abnormal   Collection Time: 10/29/20  1:12 PM  Result Value Ref Range   Glucose-Capillary 186 (H) 70 - 99 mg/dL  CBG monitoring, ED     Status: Abnormal   Collection Time: 10/29/20  2:16 PM  Result Value Ref Range   Glucose-Capillary 187 (H) 70 - 99 mg/dL   Basic metabolic panel     Status: Abnormal   Collection Time: 10/29/20  3:00 PM  Result Value Ref Range   Sodium 144 135 - 145 mmol/L   Potassium 3.7 3.5 - 5.1 mmol/L   Chloride 105 98 - 111 mmol/L   CO2 28 22 - 32 mmol/L   Glucose, Bld 192 (H) 70 - 99 mg/dL   BUN 12 8 - 23 mg/dL   Creatinine, Ser 0.39 (L) 0.44 - 1.00 mg/dL   Calcium 8.7 (L) 8.9 - 10.3 mg/dL   GFR, Estimated >60 >60 mL/min   Anion gap 11 5 - 15  CBG monitoring, ED     Status: Abnormal   Collection Time: 10/29/20  5:16 PM  Result Value Ref Range   Glucose-Capillary 165 (H) 70 - 99 mg/dL  CBG monitoring, ED     Status: Abnormal   Collection Time: 10/29/20  8:43 PM  Result Value Ref Range   Glucose-Capillary 169 (H) 70 - 99 mg/dL  CBG monitoring, ED     Status: Abnormal   Collection Time: 10/29/20 11:47 PM  Result Value Ref Range   Glucose-Capillary  197 (H) 70 - 99 mg/dL  CBC     Status: Abnormal   Collection Time: 10/30/20  5:53 AM  Result Value Ref Range   WBC 19.9 (H) 4.0 - 10.5 K/uL   RBC 4.28 3.87 - 5.11 MIL/uL   Hemoglobin 10.7 (L) 12.0 - 15.0 g/dL   HCT 34.0 (L) 36.0 - 46.0 %   MCV 79.4 (L) 80.0 - 100.0 fL   MCH 25.0 (L) 26.0 - 34.0 pg   MCHC 31.5 30.0 - 36.0 g/dL   RDW 15.8 (H) 11.5 - 15.5 %   Platelets 311 150 - 400 K/uL   nRBC 0.0 0.0 - 0.2 %  Magnesium     Status: None   Collection Time: 10/30/20  5:53 AM  Result Value Ref Range   Magnesium 1.9 1.7 - 2.4 mg/dL  Phosphorus     Status: Abnormal   Collection Time: 10/30/20  5:53 AM  Result Value Ref Range   Phosphorus 1.6 (L) 2.5 - 4.6 mg/dL  Comprehensive metabolic panel     Status: Abnormal   Collection Time: 10/30/20  5:53 AM  Result Value Ref Range   Sodium 143 135 - 145 mmol/L   Potassium 3.8 3.5 - 5.1 mmol/L   Chloride 103 98 - 111 mmol/L   CO2 26 22 - 32 mmol/L   Glucose, Bld 229 (H) 70 - 99 mg/dL   BUN 11 8 - 23 mg/dL   Creatinine, Ser 0.39 (L) 0.44 - 1.00 mg/dL   Calcium 8.6 (L) 8.9 - 10.3 mg/dL   Total Protein 5.2 (L) 6.5 -  8.1 g/dL   Albumin 2.0 (L) 3.5 - 5.0 g/dL   AST 17 15 - 41 U/L   ALT 12 0 - 44 U/L   Alkaline Phosphatase 130 (H) 38 - 126 U/L   Total Bilirubin 0.9 0.3 - 1.2 mg/dL   GFR, Estimated >60 >60 mL/min   Anion gap 14 5 - 15  CBG monitoring, ED     Status: Abnormal   Collection Time: 10/30/20  7:40 AM  Result Value Ref Range   Glucose-Capillary 206 (H) 70 - 99 mg/dL  Glucose, capillary     Status: Abnormal   Collection Time: 10/30/20  9:35 AM  Result Value Ref Range   Glucose-Capillary 217 (H) 70 - 99 mg/dL    Cultures: Results for orders placed or performed during the hospital encounter of 10/28/20  SARS Coronavirus 2 by RT PCR (hospital order, performed in [place] hospital lab) Nasopharyngeal Nasopharyngeal Swab     Status: None   Collection Time: 10/28/20  6:44 PM   Specimen: Nasopharyngeal Swab  Result Value Ref Range Status   SARS Coronavirus 2 NEGATIVE NEGATIVE Final    Comment: (NOTE) SARS-CoV-2 target nucleic acids are NOT DETECTED.  The SARS-CoV-2 RNA is generally detectable in upper and lower respiratory specimens during the acute phase of infection. The lowest concentration of SARS-CoV-2 viral copies this assay can detect is 250 copies / mL. A negative result does not preclude SARS-CoV-2 infection and should not be used as the sole basis for treatment or other patient management decisions.  A negative result may occur with improper specimen collection / handling, submission of specimen other than nasopharyngeal swab, presence of viral mutation(s) within the areas targeted by this assay, and inadequate number of viral copies (<250 copies / mL). A negative result must be combined with clinical observations, patient history, and epidemiological information.  Fact Sheet for Patients:   StrictlyIdeas.no  Fact Sheet  for Healthcare Providers: BankingDealers.co.za  This test is not yet approved or  cleared by the [place] and has been authorized for detection and/or diagnosis of SARS-CoV-2 by FDA under an Emergency Use Authorization (EUA).  This EUA will remain in effect (meaning this test can be used) for the duration of the COVID-19 declaration under Section 564(b)(1) of the Act, 21 U.S.C. section 360bbb-3(b)(1), unless the authorization is terminated or revoked sooner.  Performed at [facility], [address] [place], [place] [address]   Blood culture (single)     Status: None (Preliminary result)   Collection Time: 10/28/20  7:26 PM   Specimen: BLOOD  Result Value Ref Range Status   Specimen Description   Final    BLOOD RIGHT ANTECUBITAL Performed at [facility], [address] [place], [place] [address]    Special Requests   Final    BOTTLES DRAWN AEROBIC AND ANAEROBIC Blood Culture adequate volume Performed at [facility], [address] [place], [place] [address]    Culture  Setup Time   Final    GRAM POSITIVE COCCI IN BOTH AEROBIC AND ANAEROBIC BOTTLES CRITICAL RESULT CALLED TO, READ BACK BY AND VERIFIED WITH: S.HALLAJI,PHARMD AT 1008 ON 10/29/20 BY GM Performed at [facility], [place] [address], [place], [place] [address]    Culture GRAM POSITIVE COCCI  Final   Report Status PENDING  Incomplete  Blood Culture ID Panel (Reflexed)     Status: Abnormal   Collection Time: 10/28/20  7:26 PM  Result Value Ref Range Status   Enterococcus faecalis NOT DETECTED NOT DETECTED Final   Enterococcus Faecium NOT DETECTED NOT DETECTED Final   Listeria monocytogenes NOT DETECTED NOT DETECTED Final   Staphylococcus species NOT DETECTED NOT DETECTED Final   Staphylococcus aureus (BCID) NOT DETECTED NOT DETECTED Final   Staphylococcus epidermidis NOT DETECTED NOT DETECTED Final   Staphylococcus lugdunensis NOT DETECTED NOT DETECTED Final   Streptococcus species DETECTED (A) NOT DETECTED Final    Comment: Not Enterococcus species, Streptococcus  agalactiae, Streptococcus pyogenes, or Streptococcus pneumoniae. CRITICAL RESULT CALLED TO, READ BACK BY AND VERIFIED WITH: S.HALLAJI,PHARMD AT 1008 ON 10/29/20 BY GM    Streptococcus agalactiae NOT DETECTED NOT DETECTED Final   Streptococcus pneumoniae NOT DETECTED NOT DETECTED Final   Streptococcus pyogenes NOT DETECTED NOT DETECTED Final   A.calcoaceticus-baumannii NOT DETECTED NOT DETECTED Final   Bacteroides fragilis NOT DETECTED NOT DETECTED Final   Enterobacterales NOT DETECTED NOT DETECTED Final   Enterobacter cloacae complex NOT DETECTED NOT DETECTED Final   Escherichia coli NOT DETECTED NOT DETECTED Final   Klebsiella aerogenes NOT DETECTED NOT DETECTED Final   Klebsiella oxytoca NOT DETECTED NOT DETECTED Final   Klebsiella pneumoniae NOT DETECTED NOT DETECTED Final   Proteus species NOT DETECTED NOT DETECTED Final   Salmonella species NOT DETECTED NOT DETECTED Final   Serratia marcescens NOT DETECTED NOT DETECTED Final   Haemophilus influenzae NOT DETECTED NOT DETECTED Final   Neisseria meningitidis NOT DETECTED NOT DETECTED Final   Pseudomonas aeruginosa NOT DETECTED NOT DETECTED Final   Stenotrophomonas maltophilia NOT DETECTED NOT DETECTED Final   Candida albicans NOT DETECTED NOT DETECTED Final   Candida auris NOT DETECTED NOT DETECTED Final   Candida glabrata NOT DETECTED NOT DETECTED Final   Candida krusei NOT DETECTED NOT DETECTED Final   Candida parapsilosis NOT DETECTED NOT DETECTED Final   Candida tropicalis NOT DETECTED NOT DETECTED Final   Cryptococcus neoformans/gattii NOT DETECTED NOT DETECTED  Final    Comment: Performed at [facility], [address] [place], [place] [address]  Culture, blood (single)     Status: None (Preliminary result)   Collection Time: 10/28/20  8:43 PM   Specimen: BLOOD  Result Value Ref Range Status   Specimen Description BLOOD BLOOD RIGHT WRIST  Final   Special Requests   Final    BOTTLES DRAWN AEROBIC AND ANAEROBIC  Blood Culture adequate volume   Culture   Final    NO GROWTH 2 DAYS Performed at [facility], [address] [place], [place] [address]    Report Status PENDING  Incomplete    Imaging: CT ABDOMEN PELVIS W CONTRAST  Result Date: 10/28/2020 CLINICAL DATA:  Nausea and vomiting with weight loss for several months EXAM: CT ABDOMEN AND PELVIS WITH CONTRAST TECHNIQUE: Multidetector CT imaging of the abdomen and pelvis was performed using the standard protocol following bolus administration of intravenous contrast. CONTRAST:  41mL OMNIPAQUE IOHEXOL 300 MG/ML  SOLN COMPARISON:  None. FINDINGS: Lower chest: No acute abnormality. Hepatobiliary: Liver is well visualized and within normal limits. Gallbladder is not well seen and may have been surgically removed. Clinical correlation is recommended. Pancreas: Unremarkable. No pancreatic ductal dilatation or surrounding inflammatory changes. Spleen: Normal in size without focal abnormality. Adrenals/Urinary Tract: Adrenal glands are within normal limits. Kidneys demonstrate a normal enhancement pattern bilaterally. Fullness of the right renal collecting system is noted secondary to extrinsic compression by the prominent uterus. Bladder is somewhat distorted secondary to the enlarged uterus but is otherwise well distended. Stomach/Bowel: Mild diverticular change of the colon is noted without evidence of diverticulitis. The appendix is within normal limits. No small bowel abnormality is seen. Small sliding-type hiatal hernia is noted. Vascular/Lymphatic: Abdominal aorta shows no aneurysmal dilatation. Prominent centrally necrotic left iliac lymph node is noted measuring 2.2 cm in short axis. No other sizable adenopathy is noted. Reproductive: The uterus is significantly enlarged measuring 8.7 by 9.9 cm in greatest AP and transverse dimensions respectively. It is elongated and diffusely decreased in attenuation with peripheral enhancement highly suggestive of  endometrial neoplasm. These changes extend into the cervical canal and may extend into the vaginal vault on the sagittal images. No definitive adnexal mass is noted. Other: No free fluid is seen. No definitive endometrial deposits are noted at this time. Postsurgical changes in the anterior abdominal wall are seen. Musculoskeletal: No acute or significant osseous findings. 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 This extends into the cervical canal and likely into the vaginal vault. Associated left iliac adenopathy is noted of a similar appearance with central necrosis. Given its diffuse distribution throughout the entire uterus, this is not felt to represent fibroid change. Ultrasound is not likely to be helpful as a diagnostic tool. Direct visualization is recommended. Tissue sampling is also recommended. Surgical consultation is also recommended. Diverticulosis without diverticulitis Prominence of the right renal collecting system and proximal right ureter secondary to extrinsic compression by the enlarged uterus. No true obstructing lesion is noted within the ureter. Electronically Signed   By: Alcide Clever M.D.   On: 10/28/2020 21:56   DG Chest Portable 1 View  Result Date: 10/28/2020 CLINICAL DATA:  Shortness of breath EXAM: PORTABLE CHEST 1 VIEW COMPARISON:  None. FINDINGS: The heart size and mediastinal contours are within normal limits. Both lungs are clear. The visualized skeletal structures are unremarkable. IMPRESSION: No active disease. Electronically Signed  By: Alcide Clever M.D.   On: 10/28/2020 19:40     Assessment   Vaginal bleeding and exam / ctscan c/w cx or endometrial cancer  Plan   Biopsies in OR today  2. Hospitalist has cleared her to be d/c after .[facility] clinic is generating a consult to GYn / Onc

## 2020-10-30 NOTE — Progress Notes (Signed)
ID  Patient underwent examination under anesthesia with cervical biopsy and endometrial curettage today.  BP 137/75 (BP Location: Left Arm)   Pulse 85   Temp 97.8 F (36.6 C)   Resp 15   Ht 5\' 4"  (1.626 m)   Wt 50.8 kg   SpO2 100%   BMI 19.22 kg/m     O/e Awake and alert Chest b/l air entry [id number] Abd soft Suprapubic mass palpable Cns non focal  Labs CBC Latest Ref Rng & Units 10/30/2020 10/29/2020 10/28/2020  WBC 4.0 - 10.5 K/uL 19.9(H) - 39.8(H)  Hemoglobin 12.0 - 15.0 g/dL 10.7(L) 11.1(L) 12.6  Hematocrit 36.0 - 46.0 % 34.0(L) 35.2(L) 40.6  Platelets 150 - 400 K/uL 311 - 544(H)    CMP Latest Ref Rng & Units 10/30/2020 10/29/2020 10/29/2020  Glucose 70 - 99 mg/dL 229(H) 192(H) 198(H)  BUN 8 - 23 mg/dL 11 12 16   Creatinine 0.44 - 1.00 mg/dL 0.39(L) 0.39(L) 0.47  Sodium 135 - 145 mmol/L 143 144 145  Potassium 3.5 - 5.1 mmol/L 3.8 3.7 3.1(L)  Chloride 98 - 111 mmol/L 103 105 103  CO2 22 - 32 mmol/L 26 28 28   Calcium 8.9 - 10.3 mg/dL 8.6(L) 8.7(L) 9.3  Total Protein 6.5 - 8.1 g/dL 5.2(L) - -  Total Bilirubin 0.3 - 1.2 mg/dL 0.9 - -  Alkaline Phos 38 - 126 U/L 130(H) - -  AST 15 - 41 U/L 17 - -  ALT 0 - 44 U/L 12 - -   Micro 10/28/2020 blood culture 1 of 2 sets Streptococcus   Impression/recommendation Necrotic uterine tumor with leukocytosis Underwent EUA and D&C and biopsies.  Streptococcus bacteremia.  Source likely uterus She is currently on Unasyn.  Leukocytosis much improved  DKA: Presented with DKA on admission.  She has no history of diabetes in the past.'s been corrected and she has been managed with insulin now.  Discussed the management with the patient in detail.

## 2020-10-30 NOTE — Op Note (Signed)
NAME: Mindy Olson, Mindy Olson MEDICAL RECORD VQ:[id number] ACCOUNT [id number] DATE OF BIRTH:March 17, 1956 FACILITY: [facility] LOCATION: [facility] PHYSICIAN:Daren Yeagle Josefine Class, MD  OPERATIVE REPORT  DATE OF PROCEDURE:  10/30/2020  PREOPERATIVE DIAGNOSIS:  Vaginal bleeding with radiographic diagnosis consistent with cervical or endometrial cancer.  POSTOPERATIVE DIAGNOSES: 1.  Vaginal bleeding. 2.  Probable endometrial cancer.  SURGEON:  Laverta Baltimore, MD  ANESTHESIA:  General endotracheal anesthesia.  PROCEDURE: 1.  Examination under anesthesia. 2.  Cervical biopsy. 3.  Endometrial curettage.  INDICATIONS:  A 65 year old female admitted to [facility] with diabetic ketoacidosis and vaginal bleeding.  CT scan done on admission demonstrated an enlarged uterus with necrotic tissue consistent with cancer.  DESCRIPTION OF PROCEDURE:  After adequate general endotracheal anesthesia, the patient was placed in dorsal supine position, legs in the candy cane stirrups.  The patient's lower abdomen, perineum and vagina were prepped and draped in normal sterile  fashion.  Timeout was performed.  Straight catheterization of the bladder yielded 400 mL clear urine.  Weighted speculum was placed into the vagina.  There was necrotic tissue that was noted.  The uterus palpated 13 weeks in size, firm, deviated slightly  to the right.  There was cervical growth at the 9 o'clock position.  This was biopsied.  An endometrial curettage was then performed with tissue consistent with necrotic cancerous tissue.  Good hemostasis was noted.  Astringent was placed on the  cervical biopsy site and silver nitrate was placed on the tenaculum site anteriorly.  DISPOSITION:  The patient tolerated the procedure well and was taken to recovery room in good condition.  HN/NUANCE  D:10/30/2020 T:10/30/2020 JOB:[id number]

## 2020-10-30 NOTE — Transfer of Care (Signed)
Immediate Anesthesia Transfer of Care Note  Patient: Mindy Olson [facility]  Procedure(s) Performed: Fractional DILATATION & CURETTAGE/HYSTEROSCOPY with Biopsy (N/A )  Patient Location: PACU  Anesthesia Type:General  Level of Consciousness: awake  Airway & Oxygen Therapy: Patient Spontanous Breathing  Post-op Assessment: Report given to RN  Post vital signs: stable  Last Vitals:  Vitals Value Taken Time  BP 137/75 10/30/20 1300  Temp    Pulse 84 10/30/20 1307  Resp 20 10/30/20 1307  SpO2 98 % 10/30/20 1307  Vitals shown include unvalidated device data.  Last Pain:  Vitals:   10/30/20 1139  TempSrc: Temporal  PainSc: 0-No pain         Complications: No complications documented.

## 2020-10-30 NOTE — ED Notes (Signed)
Responded to call bell. Pt reported small amount of emesis and requested a new emesis bag and warm wash cloth. Emesis appeared clear with some small light brown particles. Approx 20-30 mL of fluid noted. Pt states she "feels better" and is ready to go back to sleep.

## 2020-10-31 ENCOUNTER — Encounter: Payer: Self-pay | Admitting: Obstetrics and Gynecology

## 2020-10-31 DIAGNOSIS — E43 Unspecified severe protein-calorie malnutrition: Secondary | ICD-10-CM | POA: Insufficient documentation

## 2020-10-31 LAB — BASIC METABOLIC PANEL
Anion gap: 9 (ref 5–15)
BUN: 8 mg/dL (ref 8–23)
CO2: 28 mmol/L (ref 22–32)
Calcium: 7.8 mg/dL — ABNORMAL LOW (ref 8.9–10.3)
Chloride: 103 mmol/L (ref 98–111)
Creatinine, Ser: 0.34 mg/dL — ABNORMAL LOW (ref 0.44–1.00)
GFR, Estimated: >60 mL/min (ref >60)
Glucose, Bld: 187 mg/dL — ABNORMAL HIGH (ref 70–99)
Potassium: 3.4 mmol/L — ABNORMAL LOW (ref 3.5–5.1)
Sodium: 140 mmol/L (ref 135–145)

## 2020-10-31 LAB — PHOSPHORUS: Phosphorus: 2.2 mg/dL — ABNORMAL LOW (ref 2.5–4.6)

## 2020-10-31 LAB — CBC
HCT: 30.6 % — ABNORMAL LOW (ref 36.0–46.0)
Hemoglobin: 9.6 g/dL — ABNORMAL LOW (ref 12.0–15.0)
MCH: 25.1 pg — ABNORMAL LOW (ref 26.0–34.0)
MCHC: 31.4 g/dL (ref 30.0–36.0)
MCV: 80.1 fL (ref 80.0–100.0)
Platelets: 264 10*3/uL (ref 150–400)
RBC: 3.82 MIL/uL — ABNORMAL LOW (ref 3.87–5.11)
RDW: 15.6 % — ABNORMAL HIGH (ref 11.5–15.5)
WBC: 12 10*3/uL — ABNORMAL HIGH (ref 4.0–10.5)
nRBC: 0 % (ref 0.0–0.2)

## 2020-10-31 LAB — GLUCOSE, CAPILLARY
Glucose-Capillary: 161 mg/dL — ABNORMAL HIGH (ref 70–99)
Glucose-Capillary: 318 mg/dL — ABNORMAL HIGH (ref 70–99)
Glucose-Capillary: 292 mg/dL — ABNORMAL HIGH (ref 70–99)
Glucose-Capillary: 213 mg/dL — ABNORMAL HIGH (ref 70–99)

## 2020-10-31 LAB — CULTURE, BLOOD (SINGLE): Special Requests: ADEQUATE

## 2020-10-31 LAB — MAGNESIUM: Magnesium: 1.5 mg/dL — ABNORMAL LOW (ref 1.7–2.4)

## 2020-10-31 MED ORDER — MAGNESIUM SULFATE 2 GM/50ML IV SOLN
2.0000 g | Freq: Once | INTRAVENOUS | Status: AC
  Administered 2020-10-31: 2 g via INTRAVENOUS
  Filled 2020-10-31: qty 50

## 2020-10-31 MED ORDER — K PHOS MONO-SOD PHOS DI & MONO 155-852-130 MG PO TABS
250.0000 mg | ORAL_TABLET | Freq: Three times a day (TID) | ORAL | Status: AC
  Administered 2020-10-31 – 2020-11-01 (×6): 250 mg via ORAL
  Filled 2020-10-31 (×8): qty 1

## 2020-10-31 MED ORDER — POTASSIUM CHLORIDE 20 MEQ PO PACK
40.0000 meq | PACK | Freq: Once | ORAL | Status: AC
  Administered 2020-10-31: 40 meq via ORAL
  Filled 2020-10-31: qty 2

## 2020-10-31 MED ORDER — IBUPROFEN 400 MG PO TABS
400.0000 mg | ORAL_TABLET | Freq: Four times a day (QID) | ORAL | Status: DC | PRN
  Administered 2020-10-31 – 2020-11-01 (×2): 400 mg via ORAL
  Filled 2020-10-31 (×3): qty 1

## 2020-10-31 NOTE — Progress Notes (Signed)
PROGRESS NOTE    Mindy Olson  [id number] DOB: [birth year] DOA: 10/28/2020 PCP: System, Provider Not In   Brief Narrative:  This 65 years old female with unknown medical history who presents with C/o:  Nausea, vomiting,  diarrhea and generalized weakness.  Patient has not seen a provider in several years.  Patient also reports daily heavy abnormal uterine bleeding and has not been evaluated.  In the ED she is found to have diabetic ketoacidosis and UTI.  CT abdomen showed diffuse enlargement of the uterus with necrosis consistent with endometrial neoplasm. Patient is admitted for DKA and was started on insulin drip.  Anion gap closed. DKA has resolved.  Patient is transitioned to long-acting and short-acting insulin.  Patient is started on IV cefepime and Flagyl for sepsis secondary to UTI.  OB/GYN consulted.  Patient underwent endoscopic ultrasound and endometrial biopsy, findings consistent with endometrial cancer.  Blood cultures grew Streptococcus, Infectious disease consulted for antibiotic regimen at discharge.  Assessment & Plan:   Principal Problem:   DKA (diabetic ketoacidosis) ([place]) Active Problems:   Sepsis secondary to UTI ([place])   AKI (acute kidney injury) ([place])   Abnormal uterine and vaginal bleeding, unspecified   Elevated bilirubin   Protein-calorie malnutrition, severe   Severe sepsis secondary to UTI / Streptococci Bacteremia :   Patient presented with tachycardia, significant leukocytosis and lactate of 2.3 with positive UA She has initially received empiric cefepime and Flagyl in the ED. Antibiotic transitioned to ceftriaxone 2 g daily. Blood cultures positive for streptococci.. Infectious disease consulted for antibiotic recommendations. Lactic acid normalized with IV fluids. Antibiotics changed to Unasyn. ID has to advise about the duration of antibiotics.  DKA with newly diagnosed diabetes >> Improved Patient presented with glucose of 624 with anion  gap of 39.  pH of 7.27, bicarb of 13.  Beta hydroxybutyrate greater than 8 Likely triggered by UTI.  Hemoglobin A1c 14.8 (uncontrolled)  -replete potassium as needed - continue insulin gtt with goal of 140-180 and AG <12 - IV NS until BG <250, then switch to D5 1/2 NS  - BMP q4hr  - keep NPO  -Anion gap closed.  Patient has been transitioned to subcu insulin. - Patient will be discharged on Lantus and Amaryl 4 mg daily  AKI >>> Resolved. pre-renal follow with repeat BMP while getting IV fluids  Postmenopausal abnormal uterine bleeding Patient has had symptoms for several years and has never seek evaluation Concerning for endometrial neoplasm GYN consulted.  Patient underwent endoscopic ultrasound with possible biopsy. Patient is well optimized for procedure. Hgb stable. Patient has outpatient appointment with OB/GYN.  Elevated bilirubin Elevated bilirubin of 3.2.  Possibly reactive due to DKA.  Patient has history of cholecystectomy.   DVT prophylaxis: SCDs Code Status: Full code Family Communication: No family at bedside Disposition Plan:  Status is: Inpatient  Remains inpatient appropriate because:Inpatient level of care appropriate due to severity of illness   Dispo: The patient is from: Home              Anticipated d/c is to: Home with Home services              Anticipated d/c date is: 1-2 days              Patient currently is not medically stable to d/c.   Difficult to place patient No   Consultants:   OB/GYN  Infectious disease  Procedures:  Antimicrobials:  Anti-infectives (From admission, onward)   Start  Dose/Rate Route Frequency Ordered Stop   10/30/20 1200  Ampicillin-Sulbactam (UNASYN) 3 g in sodium chloride 0.9 % 100 mL IVPB        3 g 200 mL/hr over 30 Minutes Intravenous Every 6 hours 10/29/20 1702     10/30/20 1000  cefTRIAXone (ROCEPHIN) 2 g in sodium chloride 0.9 % 100 mL IVPB  Status:  Discontinued        2 g 200 mL/hr over 30  Minutes Intravenous Every 24 hours 10/29/20 1046 10/29/20 1702   10/30/20 0000  cefTRIAXone (ROCEPHIN) 1 g in sodium chloride 0.9 % 100 mL IVPB  Status:  Discontinued        1 g 200 mL/hr over 30 Minutes Intravenous Every 24 hours 10/29/20 0014 10/29/20 1046   10/29/20 1830  metroNIDAZOLE (FLAGYL) IVPB 500 mg        500 mg 100 mL/hr over 60 Minutes Intravenous Every 8 hours 10/29/20 1702 10/30/20 0738   10/29/20 1800  cefTRIAXone (ROCEPHIN) 2 g in sodium chloride 0.9 % 100 mL IVPB        2 g 200 mL/hr over 30 Minutes Intravenous  Once 10/29/20 1702 10/29/20 1834   10/28/20 1930  ceFEPIme (MAXIPIME) 2 g in sodium chloride 0.9 % 100 mL IVPB        2 g 200 mL/hr over 30 Minutes Intravenous  Once 10/28/20 1919 10/28/20 2102   10/28/20 1930  metroNIDAZOLE (FLAGYL) IVPB 500 mg        500 mg 100 mL/hr over 60 Minutes Intravenous  Once 10/28/20 1919 10/28/20 2207      Subjective: Patient was seen and examined at bedside.  Overnight events noted.   She underwent endometrial biopsy ,   tolerated well. Patient reports feeling generalized weakness,  states she does not feel going home today. She reports pain is better controlled,  denies any nausea and vomiting.  Objective: Vitals:   10/30/20 1632 10/30/20 2015 10/31/20 0104 10/31/20 0746  BP: 137/71 126/75 127/68 134/87  Pulse: 85 86 93 84  Resp: 16 16 16 16   Temp: 98 F (36.7 C) 98.1 F (36.7 C) 98 F (36.7 C) 98.4 F (36.9 C)  TempSrc:  Oral Oral Oral  SpO2: 99% 98% 99% 99%  Weight:      Height:        Intake/Output Summary (Last 24 hours) at 10/31/2020 1428 Last data filed at 10/31/2020 0658 Gross per 24 hour  Intake 321.44 ml  Output 2500 ml  Net -2178.56 ml   Filed Weights   10/28/20 1733  Weight: 50.8 kg    Examination:  General exam: Appears calm and comfortable, not in any acute distress. Respiratory system: Clear to auscultation. Respiratory effort normal. Cardiovascular system: S1 & S2 heard, RRR. No JVD, murmurs,  rubs, gallops or clicks. No pedal edema. Gastrointestinal system: Abdomen is nondistended, soft and nontender. No organomegaly or masses felt. Normal bowel sounds heard. Central nervous system: Alert and oriented. No focal neurological deficits. Extremities: Symmetric 5 x 5 power. Skin: No rashes, lesions or ulcers Psychiatry: Judgement and insight appear normal. Mood & affect appropriate.     Data Reviewed: I have personally reviewed following labs and imaging studies  CBC: Recent Labs  Lab 10/28/20 1737 10/29/20 1219 10/30/20 0553 10/31/20 0546  WBC 39.8*  --  19.9* 12.0*  NEUTROABS 36.7*  --   --   --   HGB 12.6 11.1* 10.7* 9.6*  HCT 40.6 35.2* 34.0* 30.6*  MCV 81.0  --  79.4* 80.1  PLT 544*  --  311 [id number]   Basic Metabolic Panel: Recent Labs  Lab 10/29/20 0841 10/29/20 1219 10/29/20 1500 10/30/20 0553 10/31/20 0546  NA 142 145 144 143 140  K 2.9* 3.1* 3.7 3.8 3.4*  CL 104 103 105 103 103  CO2 29 28 28 26 28   GLUCOSE 234* 198* 192* 229* 187*  BUN 17 16 12 11 8   CREATININE 0.44 0.47 0.39* 0.39* 0.34*  CALCIUM 8.7* 9.3 8.7* 8.6* 7.8*  MG  --   --   --  1.9 1.5*  PHOS  --   --   --  1.6* 2.2*   GFR: Estimated Creatinine Clearance: 56.2 mL/min (A) (by C-G formula based on SCr of 0.34 mg/dL (L)). Liver Function Tests: Recent Labs  Lab 10/28/20 1737 10/30/20 0553  AST 16 17  ALT 14 12  ALKPHOS 217* 130*  BILITOT 3.2* 0.9  PROT 6.9 5.2*  ALBUMIN 2.7* 2.0*   No results for input(s): LIPASE, AMYLASE in the last 168 hours. No results for input(s): AMMONIA in the last 168 hours. Coagulation Profile: No results for input(s): INR, PROTIME in the last 168 hours. Cardiac Enzymes: No results for input(s): CKTOTAL, CKMB, CKMBINDEX, TROPONINI in the last 168 hours. BNP (last 3 results) No results for input(s): PROBNP in the last 8760 hours. HbA1C: Recent Labs    10/28/20 2349  HGBA1C 14.8*   CBG: Recent Labs  Lab 10/30/20 1305 10/30/20 1631 10/30/20 2102  10/31/20 0746 10/31/20 1215  GLUCAP 225* 221* 204* 161* 318*   Lipid Profile: No results for input(s): CHOL, HDL, LDLCALC, TRIG, CHOLHDL, LDLDIRECT in the last 72 hours. Thyroid Function Tests: No results for input(s): TSH, T4TOTAL, FREET4, T3FREE, THYROIDAB in the last 72 hours. Anemia Panel: No results for input(s): VITAMINB12, FOLATE, FERRITIN, TIBC, IRON, RETICCTPCT in the last 72 hours. Sepsis Labs: Recent Labs  Lab 10/28/20 1834 10/28/20 2043 10/29/20 0116 10/29/20 0314  LATICACIDVEN 2.2* 2.3* 2.1* 1.7    Recent Results (from the past 240 hour(s))  SARS Coronavirus 2 by RT PCR (hospital order, performed in [facility] hospital lab) Nasopharyngeal Nasopharyngeal Swab     Status: None   Collection Time: 10/28/20  6:44 PM   Specimen: Nasopharyngeal Swab  Result Value Ref Range Status   SARS Coronavirus 2 NEGATIVE NEGATIVE Final    Comment: (NOTE) SARS-CoV-2 target nucleic acids are NOT DETECTED.  The SARS-CoV-2 RNA is generally detectable in upper and lower respiratory specimens during the acute phase of infection. The lowest concentration of SARS-CoV-2 viral copies this assay can detect is 250 copies / mL. A negative result does not preclude SARS-CoV-2 infection and should not be used as the sole basis for treatment or other patient management decisions.  A negative result may occur with improper specimen collection / handling, submission of specimen other than nasopharyngeal swab, presence of viral mutation(s) within the areas targeted by this assay, and inadequate number of viral copies (<250 copies / mL). A negative result must be combined with clinical observations, patient history, and epidemiological information.  Fact Sheet for Patients:   StrictlyIdeas.no  Fact Sheet for Healthcare Providers: BankingDealers.co.za  This test is not yet approved or  cleared by the [place] FDA and has been authorized for  detection and/or diagnosis of SARS-CoV-2 by FDA under an Emergency Use Authorization (EUA).  This EUA will remain in effect (meaning this test can be used) for the duration of the COVID-19 declaration under Section 564(b)(1) of the Act,  21 U.S.C. section 360bbb-3(b)(1), unless the authorization is terminated or revoked sooner.  Performed at [facility], [address] [place], [place] [address]   Blood culture (single)     Status: Abnormal   Collection Time: 10/28/20  7:26 PM   Specimen: BLOOD  Result Value Ref Range Status   Specimen Description   Final    BLOOD RIGHT ANTECUBITAL Performed at [facility], [address] [place], [place] [address]    Special Requests   Final    BOTTLES DRAWN AEROBIC AND ANAEROBIC Blood Culture adequate volume Performed at [facility], [place], [place] [address]    Culture  Setup Time   Final    GRAM POSITIVE COCCI IN BOTH AEROBIC AND ANAEROBIC BOTTLES CRITICAL RESULT CALLED TO, READ BACK BY AND VERIFIED WITH: S.HALLAJI,PHARMD AT 1008 ON 10/29/20 BY GM Performed at [facility], [place] [address] [place], [place] [address]    Culture STREPTOCOCCUS SALIVARIUS (A)  Final   Report Status 10/31/2020 FINAL  Final   Organism ID, Bacteria STREPTOCOCCUS SALIVARIUS  Final      Susceptibility   Streptococcus salivarius - MIC*    PENICILLIN 0.12 SENSITIVE Sensitive     CEFTRIAXONE <=0.12 SENSITIVE Sensitive     ERYTHROMYCIN <=0.12 SENSITIVE Sensitive     LEVOFLOXACIN 4 INTERMEDIATE Intermediate     VANCOMYCIN 1 SENSITIVE Sensitive     * STREPTOCOCCUS SALIVARIUS  Blood Culture ID Panel (Reflexed)     Status: Abnormal   Collection Time: 10/28/20  7:26 PM  Result Value Ref Range Status   Enterococcus faecalis NOT DETECTED NOT DETECTED Final   Enterococcus Faecium NOT DETECTED NOT DETECTED Final   Listeria monocytogenes NOT DETECTED NOT DETECTED Final   Staphylococcus species NOT DETECTED NOT DETECTED  Final   Staphylococcus aureus (BCID) NOT DETECTED NOT DETECTED Final   Staphylococcus epidermidis NOT DETECTED NOT DETECTED Final   Staphylococcus lugdunensis NOT DETECTED NOT DETECTED Final   Streptococcus species DETECTED (A) NOT DETECTED Final    Comment: Not Enterococcus species, Streptococcus agalactiae, Streptococcus pyogenes, or Streptococcus pneumoniae. CRITICAL RESULT CALLED TO, READ BACK BY AND VERIFIED WITH: S.HALLAJI,PHARMD AT 1008 ON 10/29/20 BY GM    Streptococcus agalactiae NOT DETECTED NOT DETECTED Final   Streptococcus pneumoniae NOT DETECTED NOT DETECTED Final   Streptococcus pyogenes NOT DETECTED NOT DETECTED Final   A.calcoaceticus-baumannii NOT DETECTED NOT DETECTED Final   Bacteroides fragilis NOT DETECTED NOT DETECTED Final   Enterobacterales NOT DETECTED NOT DETECTED Final   Enterobacter cloacae complex NOT DETECTED NOT DETECTED Final   Escherichia coli NOT DETECTED NOT DETECTED Final   Klebsiella aerogenes NOT DETECTED NOT DETECTED Final   Klebsiella oxytoca NOT DETECTED NOT DETECTED Final   Klebsiella pneumoniae NOT DETECTED NOT DETECTED Final   Proteus species NOT DETECTED NOT DETECTED Final   Salmonella species NOT DETECTED NOT DETECTED Final   Serratia marcescens NOT DETECTED NOT DETECTED Final   Haemophilus influenzae NOT DETECTED NOT DETECTED Final   Neisseria meningitidis NOT DETECTED NOT DETECTED Final   Pseudomonas aeruginosa NOT DETECTED NOT DETECTED Final   Stenotrophomonas maltophilia NOT DETECTED NOT DETECTED Final   Candida albicans NOT DETECTED NOT DETECTED Final   Candida auris NOT DETECTED NOT DETECTED Final   Candida glabrata NOT DETECTED NOT DETECTED Final   Candida krusei NOT DETECTED NOT DETECTED Final   Candida parapsilosis NOT DETECTED NOT DETECTED Final   Candida tropicalis NOT DETECTED NOT DETECTED Final   Cryptococcus neoformans/gattii NOT DETECTED  NOT DETECTED Final    Comment: Performed at [facility], [address] [place], [place] [address]  Culture, blood (single)     Status: None (Preliminary result)   Collection Time: 10/28/20  8:43 PM   Specimen: BLOOD  Result Value Ref Range Status   Specimen Description BLOOD BLOOD RIGHT WRIST  Final   Special Requests   Final    BOTTLES DRAWN AEROBIC AND ANAEROBIC Blood Culture adequate volume   Culture   Final    NO GROWTH 3 DAYS Performed at [facility], [address], [place], [place] [address]    Report Status PENDING  Incomplete  CULTURE, BLOOD (ROUTINE X 2) w Reflex to ID Panel     Status: None (Preliminary result)   Collection Time: 10/30/20 11:43 PM   Specimen: BLOOD  Result Value Ref Range Status   Specimen Description BLOOD BLOOD RIGHT HAND  Final   Special Requests   Final    BOTTLES DRAWN AEROBIC AND ANAEROBIC Blood Culture adequate volume   Culture   Final    NO GROWTH < 12 HOURS Performed at [facility], [address] [place], [place] [address]    Report Status PENDING  Incomplete  CULTURE, BLOOD (ROUTINE X 2) w Reflex to ID Panel     Status: None (Preliminary result)   Collection Time: 10/30/20 11:43 PM   Specimen: BLOOD  Result Value Ref Range Status   Specimen Description BLOOD BLOOD LEFT HAND  Final   Special Requests   Final    BOTTLES DRAWN AEROBIC AND ANAEROBIC Blood Culture adequate volume   Culture   Final    NO GROWTH < 12 HOURS Performed at [facility], [address], [place], [place] [address]    Report Status PENDING  Incomplete     Radiology Studies: No results found. Scheduled Meds: . feeding supplement  237 mL Oral TID BM  . insulin aspart  0-15 Units Subcutaneous TID WC  . insulin glargine  10 Units Subcutaneous Daily  . phosphorus  250 mg Oral TID  . potassium chloride  40 mEq Oral Once   Continuous Infusions: . sodium chloride 100 mL/hr at 10/31/20 0404  . ampicillin-sulbactam (UNASYN) IV 3 g (10/31/20 1214)     LOS: 3 days    Time spent: 25 mins    Bliss Tsang, MD [facility]   If 7PM-7AM, please contact night-coverage

## 2020-10-31 NOTE — Progress Notes (Signed)
Inpatient Diabetes Program Recommendations  AACE/ADA: New Consensus Statement on Inpatient Glycemic Control (2015)  Target Ranges:  Prepandial:   less than 140 mg/dL      Peak postprandial:   less than 180 mg/dL (1-2 hours)      Critically ill patients:  140 - 180 mg/dL   Results for Mindy Olson, Mindy Olson (MRN [id number]) as of 10/31/2020 07:05  Ref. Range 10/30/2020 07:40 10/30/2020 09:35 10/30/2020 11:38 10/30/2020 13:05 10/30/2020 16:31 10/30/2020 21:02  Glucose-Capillary Latest Ref Range: 70 - 99 mg/dL 206 (H) 217 (H)  5 units NOVOLOG  240 (H)  4 units NOVOLOG  10 units LANTUS _0 :45am  225 (H) 221 (H)  5 units NOVOLOG  204 (H)   Results for Mindy Olson, Mindy Olson (MRN [id number]) as of 10/31/2020 11:58  Ref. Range 10/31/2020 07:46  Glucose-Capillary Latest Ref Range: 70 - 99 mg/dL 161 (H)   Results for Mindy Olson, Mindy Olson (MRN [id number]) as of 10/31/2020 07:05  Ref. Range 10/28/2020 23:49  Hemoglobin A1C Latest Ref Range: 4.8 - 5.6 % 14.8 (H)  (378 mg/dl)    Admit with: DKA/ New Diagnosis of DM/ Sepsis with UTI   Current Orders: Lantus 10 units Daily      Novolog Moderate Correction Scale/ SSI (0-15 units) TID AC     Endoscopic ultrasound and endometrial biopsy, findings consistent with endometrial cancer 02/01  New Diagnosis of Diabetes  MD--Given A1c of 14.8% and admission for DKA, will likely need Insulin for home  Pt would likely benefit from simple regimen for home.  Seemed very overwhelmed with new diagnosis of diabetes.  Asked me if we could do one injection of insulin per day.  Perhaps we could try Lantus Once per day along with a Sulfonylurea like Amaryl 2 mg Daily??   Addendum 10:45am--Met with pt and daughter at bedside.  Spoke with pt about new diagnosis.  Discussed A1C results with them and explained what an A1C is, basic pathophysiology of DM Type 2, basic home care, basic diabetes diet nutrition principles, importance of checking CBGs and maintaining good CBG control to  prevent long-term and short-term complications.  Reviewed signs and symptoms of hyperglycemia and hypoglycemia and how to treat hypoglycemia at home.  Also reviewed blood sugar goals and A1c goals for home.    RNs to provide ongoing basic DM education at bedside with this patient.  Have ordered educational booklet, insulin starter kit.  Have also placed RD consult for DM diet education for this patient.  Pt seemed overwhelmed with all the info provided and asked me if we could keep her diabetes meds simple.  Discussed with pt that I will let the MD know this and we will see if we can keep her insulin injections to once per day and perhaps add an oral DM med to help with meal times.  Strongly encouraged pt to get established with a PCP after discharge for further diabetes management.  Discussed with pt and dtr that it may take several weeks to achieve goal CBGs at home and stressed the importance of checking CBGs at least TID before meals and occasionally after meals at home to acquire sufficient CBG data for her next MD appt.    Educated patient's daughter on insulin pen use at home.  Reviewed all steps of insulin pen including attachment of needle, 2-unit air shot, dialing up dose, giving injection, rotation of injection sites, removing needle, disposal of sharps, storage of unused insulin, disposal of insulin etc.  Patient able to provide  successful return demonstration.  Reviewed troubleshooting with insulin pen.  Also reviewed Signs/Symptoms of Hypoglycemia with patient and how to treat Hypoglycemia at home.  Have asked RNs caring for patient to please allow patient to give all injections here in hospital as much as possible for practice.  MD to give patient Rxs for insulin pens and insulin pen needles.     --Will follow patient during hospitalization--  Wyn Quaker RN, MSN, CDE Diabetes Coordinator Inpatient Glycemic Control Team Team Pager: [phone] (8a-5p)

## 2020-10-31 NOTE — TOC Progression Note (Signed)
Transition of Care [facility]) - Progression Note    Patient Details  Name: Mindy Olson MRN: [id number] Date of Birth: Dec 13, 1955  [facility] of [facility]) CM/SW Contact  Shelbie Ammons, RN Phone Number: 10/31/2020, 2:14 PM  Clinical Narrative:  RNCM met with patient in room. Patient sitting up in chair on arrival. Patient reports that she is very stressed but that she understands that plans need to be made. She is agreeable to assistance with setting up a PCP but reports that she will only see someone in [place]. Patient is also agreeable to home health but does not have preference of agency. She is agreeable to getting a rolling walker and 3N1.  RNCM reached out to [facility] and [facility], neither are able to offer an appointment before end of May. RNCM reached out to [facility] primary care. Appt made for March 10th.  RNCM reached out to [facility] with [facility] and he will accept referral for home health.  RNCM reached out to [facility] with [employer] for equipment.      Expected Discharge Plan: [place] Barriers to Discharge: Continued Medical Work up,ED Medication assistance  Expected Discharge Plan and Services Expected Discharge Plan: [place] In-house Referral: Clinical Social Work   Post Acute Care Choice: [place] arrangements for the past 2 months: Single Family Home                                       Social Determinants of Health (SDOH) Interventions    Readmission Risk Interventions No flowsheet data found.

## 2020-10-31 NOTE — Progress Notes (Signed)
   Date of Admission:  10/28/2020      ID: Mindy Olson is a 65 y.o. female  Principal Problem:   DKA (diabetic ketoacidosis) ([place]) Active Problems:   Sepsis secondary to UTI ([place])   AKI (acute kidney injury) ([place])   Abnormal uterine and vaginal bleeding, unspecified   Elevated bilirubin   Protein-calorie malnutrition, severe    Subjective: Pt is feeling weak but slightly better than before She says she walked with PT in the room   Medications:  . feeding supplement  237 mL Oral TID BM  . insulin aspart  0-15 Units Subcutaneous TID WC  . insulin glargine  10 Units Subcutaneous Daily  . phosphorus  250 mg Oral TID  . potassium chloride  40 mEq Oral Once    Objective: Vital signs in last 24 hours: Temp:  [97.6 F (36.4 C)-98.4 F (36.9 C)] 98.4 F (36.9 C) (02/02 0746) Pulse Rate:  [80-93] 84 (02/02 0746) Resp:  [12-16] 16 (02/02 0746) BP: (115-137)/(61-87) 134/87 (02/02 0746) SpO2:  [98 %-100 %] 99 % (02/02 0746)  PHYSICAL EXAM:  General: Alert, cooperative, no distress, appears stated age.  Head: Normocephalic, without obvious abnormality, atraumatic. Eyes: Conjunctivae clear, anicteric sclerae. Pupils are equal ENT Nares normal. No drainage or sinus tenderness. Poor dentition Neck: Supple, symmetrical, no adenopathy, thyroid: non tender no carotid bruit and no JVD. Lungs: Clear to auscultation bilaterally. No Wheezing or Rhonchi. No rales. Heart: Regular rate and rhythm, no murmur, rub or gallop. Abdomen: Soft, palpable uterus above the pubic bone Extremities: atraumatic, no cyanosis. No edema. No clubbing Skin: No rashes or lesions. Or bruising Lymph: Cervical, supraclavicular normal. Neurologic: Grossly non-focal  Lab Results Recent Labs    10/30/20 0553 10/31/20 0546  WBC 19.9* 12.0*  HGB 10.7* 9.6*  HCT 34.0* 30.6*  NA 143 140  K 3.8 3.4*  CL 103 103  CO2 26 28  BUN 11 8  CREATININE 0.39* 0.34*   Liver Panel Recent Labs     10/28/20 1737 10/30/20 0553  PROT 6.9 5.2*  ALBUMIN 2.7* 2.0*  AST 16 17  ALT 14 12  ALKPHOS 217* 130*  BILITOT 3.2* 0.9   SMIcro 10/28/20- BC strep salivarius 1 of 2 set 10/30/20- BC NG    Assessment/Plan:  Necrotic uterine tumor with leucocytosis S/p EUA, D/C and biopsy  Strep salivarius bacteremia- this organism is an oral organism-usually not expected to cause endometrial infection  will get 2 d echo to make sure no vegetation Continue unasyn- will decide on oral antibiotics once endocarditis is ruled out Repeat blood culture neg so far  DKA- on admission- no h/o DM in the past. Currently on insulin Discussed the management with the patient , daughter and care team

## 2020-10-31 NOTE — Evaluation (Signed)
Physical Therapy Evaluation Patient Details Name: Mindy ReiningBetty J Olson MRN: [id number] DOB: Jul 19, 1956 Today's Date: 10/31/2020   History of Present Illness  65 years old female with unknown medical history who presents with C/o:  Nausea, vomiting,  diarrhea and generalized weakness.  Patient has not seen a provider in several years.  Patient also reports daily heavy abnormal uterine bleeding and has not been evaluated.  In the ED she is found to have diabetic ketoacidosis and UTI.  CT abdomen showed diffuse enlargement of the uterus with necrosis consistent with endometrial neoplasm.  Patient is admitted for DKA and was started on insulin drip.  Anion gap closed. DKA has resolved.  Patient is transitioned to long-acting and short-acting insulin.  Patient is started on IV cefepime and Flagyl for sepsis secondary to UTI.  OB/GYN consulted.  Patient underwent endoscopic ultrasound and endometrial biopsy, findings consistent with endometrial cancer.  Clinical Impression  Pt anxious t/o the session but did show good effort with what she was able to do.  Her HR was elevated t/o the session and even with prolonged sitting rest break did not drop below 130 bpm.  Pt showed severe weakness in both U and LEs and though she admits to only one fall in the last 6 months she states that recently she has been weak and weaker at home with decreased steadiness and confidence with mobility in the home. She has 4 steps to enter her home and was very anxious about the idea of even trying them today (ultimatley did not 2/2 HR) but she will need to trial these before home.  Daughter present t/o the session and understood that her mother will need increased care at home and is willing to do so.  Pt will benefit from HHPT, RW, and BSC, educated pt and daughter about plan of care and all questions answered.     Follow Up Recommendations Home health PT;Supervision for mobility/OOB    Equipment Recommendations  Rolling walker with  5" wheels;3in1 (PT)    Recommendations for Other Services       Precautions / Restrictions Precautions Precautions: Fall Restrictions Weight Bearing Restrictions: No      Mobility  Bed Mobility Overal bed mobility: Needs Assistance Bed Mobility: Supine to Sit     Supine to sit: Min guard;Min assist Sit to supine: Min guard   General bed mobility comments: Pt able to get herself to sitting EOB with only light assist,    Transfers Overall transfer level: Needs assistance Equipment used: Rolling walker (2 wheeled) Transfers: Sit to/from Stand Sit to Stand: Mod assist Stand pivot transfers: Min assist       General transfer comment: Pt was able to rise from standard height bed with heavy UE use but no physical assist, however even with hand rails and cuing for appropriate postioining she was unable to rise from toilet and ultimately needed moderate assist just to attain standing.  Once up she was stable with the use of UE on the walker.  Ambulation/Gait Ambulation/Gait assistance: Min guard Gait Distance (Feet): 35 Feet Assistive device: Rolling walker (2 wheeled)       General Gait Details: Pt was able to do some limited in-room ambulation with walker (does not use walker at baseline).  Her HR was in the 140s most of the time and she reported feeling fatigued (and anxious) t/o the effort.  She did not have any LOBs or needed direct assist but clearly she lacked confidence and is not at all near  her baseline.  Intended to try another bout of ambulation (and possible steps) after sitting rest break but per HR sustained in the 130s and she kept repeating that she did not feel confident to even try given how weak she was feeling  Stairs            Wheelchair Mobility    Modified Rankin (Stroke Patients Only)       Balance Overall balance assessment: Needs assistance Sitting-balance support: Feet supported Sitting balance-Leahy Scale: Good     Standing balance  support: Bilateral upper extremity supported Standing balance-Leahy Scale: Poor Standing balance comment: with walker she did not have any LOBs, but during transitions w/o UE support pt very guarded and cautious                             Pertinent Vitals/Pain Pain Assessment: Faces Faces Pain Scale: Hurts a little bit Pain Location: generalized - "I just don't feel good" Pain Descriptors / Indicators: Discomfort Pain Intervention(s): Limited activity within patient's tolerance;Repositioned;Monitored during session    Home Living Family/patient expects to be discharged to:: Private residence Living Arrangements: Alone Available Help at Discharge: Family;Available 24 hours/day (daughter taking FMLA to stay with her) Type of Home: House Home Access: Stairs to enter Entrance Stairs-Rails: Right Entrance Stairs-Number of Steps: 4 Home Layout: One level Home Equipment: Shower seat      Prior Function Level of Independence: Independent         Comments: Until a few weeks ago pt was completely independent; driving, managing the home, etc w/o issue or assist     Hand Dominance   Dominant Hand: Right    Extremity/Trunk Assessment   Upper Extremity Assessment Upper Extremity Assessment: Generalized weakness (Pt with noted weakness, most notedly in b/l triceps.  Grossly 3/5)    Lower Extremity Assessment Lower Extremity Assessment: Generalized weakness (Pt also with considerable global weakness in LEs, could not hold quad strength against even a few pounds of pressure)       Communication   Communication: No difficulties  Cognition Arousal/Alertness: Awake/alert Behavior During Therapy: Anxious Overall Cognitive Status: Within Functional Limits for tasks assessed                                        General Comments General comments (skin integrity, edema, etc.): Pt was weaker than expected given that last month she was independently  managing at home alone    Exercises     Assessment/Plan    PT Assessment Patient needs continued PT services  PT Problem List Decreased strength;Decreased activity tolerance;Decreased balance;Decreased mobility;Decreased knowledge of use of DME;Decreased safety awareness;Cardiopulmonary status limiting activity;Pain       PT Treatment Interventions Stair training;Gait training;DME instruction;Functional mobility training;Therapeutic activities;Therapeutic exercise;Balance training;Neuromuscular re-education;Patient/family education    PT Goals (Current goals can be found in the Care Plan section)  Acute Rehab PT Goals Patient Stated Goal: " I want to go home" PT Goal Formulation: With patient Time For Goal Achievement: 11/14/20 Potential to Achieve Goals: Good    Frequency Min 2X/week   Barriers to discharge        Co-evaluation               AM-PAC PT "6 Clicks" Mobility  Outcome Measure Help needed turning from your back to your side while in a flat bed  without using bedrails?: None Help needed moving from lying on your back to sitting on the side of a flat bed without using bedrails?: A Little Help needed moving to and from a bed to a chair (including a wheelchair)?: A Little Help needed standing up from a chair using your arms (e.g., wheelchair or bedside chair)?: A Lot Help needed to walk in hospital room?: A Little Help needed climbing 3-5 steps with a railing? : A Lot 6 Click Score: 17    End of Session Equipment Utilized During Treatment: Gait belt Activity Tolerance: Patient limited by fatigue Patient left: in chair;with call bell/phone within reach;with family/visitor present Nurse Communication: Mobility status (elevated HR even during rest) PT Visit Diagnosis: Muscle weakness (generalized) (M62.81);Difficulty in walking, not elsewhere classified (R26.2)    Time: 1127-1210 PT Time Calculation (min) (ACUTE ONLY): 43 min   Charges:   PT  Evaluation $PT Eval Low Complexity: 1 Low PT Treatments $Gait Training: 8-22 mins        Kreg Shropshire, DPT 10/31/2020, 1:11 PM

## 2020-10-31 NOTE — Evaluation (Signed)
Occupational Therapy Evaluation Patient Details Name: Mindy Olson MRN: [id number] DOB: 20-Mar-1956 Today's Date: 10/31/2020    History of Present Illness 65 years old female with unknown medical history who presents with C/o:  Nausea, vomiting,  diarrhea and generalized weakness.  Patient has not seen a provider in several years.  Patient also reports daily heavy abnormal uterine bleeding and has not been evaluated.  In the ED she is found to have diabetic ketoacidosis and UTI.  CT abdomen showed diffuse enlargement of the uterus with necrosis consistent with endometrial neoplasm.  Patient is admitted for DKA and was started on insulin drip.  Anion gap closed. DKA has resolved.  Patient is transitioned to long-acting and short-acting insulin.  Patient is started on IV cefepime and Flagyl for sepsis secondary to UTI.  OB/GYN consulted.  Patient underwent endoscopic ultrasound and endometrial biopsy, findings consistent with endometrial cancer.   Clinical Impression   Patient presenting with decreased I in self care, balance, functional mobility/transfer, endurance, and safety awareness. Patient reports living alone and being fully independent with all aspects of care PTA. Pt drives and performs all self care tasks. Pt's daughter has been assisting with groceries since pandemic. Patient currently needing min - mod A for functional transfers with use of RW. Pt donning hospital socks with set up A. Once standing, pt bleeding with ambulating to toilet. Pt able to void at commode but then needing to perform LB bathing secondary to blood. RN notified. Pt donning mesh underwear and maternity pads with min A to pull over B hips with min A for standing balance. Pt returns with bed with RW and min A. OT educated pt and daughter on need for 24/7 supervision/assist for safety at discharge. Daughter reports plan to take FMLA to assist at home. Pt is agreeable to [place]. Patient will benefit from acute OT to increase  overall independence in the areas of ADLs, functional mobility, and safety awareness in order to safely discharge home with family.    Follow Up Recommendations  Home health OT;Supervision/Assistance - 24 hour    Equipment Recommendations  Other (comment) (RW)       Precautions / Restrictions Precautions Precautions: Fall      Mobility Bed Mobility Overal bed mobility: Needs Assistance Bed Mobility: Supine to Sit;Sit to Supine     Supine to sit: Min guard Sit to supine: Min guard   General bed mobility comments: min cuing for technique with  HOB elevated    Transfers Overall transfer level: Needs assistance Equipment used: None;Rolling walker (2 wheeled) Transfers: Sit to/from [facility] Sit to Stand: Min assist;Mod assist Stand pivot transfers: Min assist       General transfer comment: mod A to stand from lower surface and without use of RW. Min A for functional transfer with use of RW    Balance Overall balance assessment: Needs assistance Sitting-balance support: Feet supported Sitting balance-Leahy Scale: Good     Standing balance support: During functional activity Standing balance-Leahy Scale: Poor                             ADL either performed or assessed with clinical judgement   ADL Overall ADL's : Needs assistance/impaired Eating/Feeding: Independent   Grooming: Wash/dry hands;Wash/dry face;Standing;Minimal assistance       Lower Body Bathing: Minimal assistance;Sit to/from stand       Lower Body Dressing: Minimal assistance;Sit to/from stand   Toilet Transfer: Moderate  assistance;RW;Grab bars   Toileting- Clothing Manipulation and Hygiene: Minimal assistance;Sit to/from stand         General ADL Comments: mod lifting assistance to stand from toilet with min A for LB clothing management. Set up A to wash B LEs while seated on commode.     Vision Patient Visual Report: No change from baseline               Pertinent Vitals/Pain Pain Assessment: Faces Faces Pain Scale: Hurts a little bit Pain Location: generalized - "I just don't feel good" Pain Descriptors / Indicators: Discomfort Pain Intervention(s): Limited activity within patient's tolerance;Repositioned;Monitored during session     Hand Dominance Right   Extremity/Trunk Assessment Upper Extremity Assessment Upper Extremity Assessment: Generalized weakness   Lower Extremity Assessment Lower Extremity Assessment: Defer to PT evaluation       Communication Communication Communication: No difficulties   Cognition Arousal/Alertness: Awake/alert Behavior During Therapy: WFL for tasks assessed/performed Overall Cognitive Status: Within Functional Limits for tasks assessed                                                Home Living Family/patient expects to be discharged to:: Private residence Living Arrangements: Alone Available Help at Discharge: Family;Available 24 hours/day Type of Home: House Home Access: Stairs to enter [employer] of Steps: 4 STE Entrance Stairs-Rails: Right Home Layout: One level     Bathroom Shower/Tub: Occupational psychologist: Standard     Home Equipment: Shower seat          Prior Functioning/Environment Level of Independence: Independent                 OT Problem List: Decreased strength;Decreased activity tolerance;Decreased knowledge of use of DME or AE;Decreased safety awareness;Impaired balance (sitting and/or standing);Decreased knowledge of precautions;Cardiopulmonary status limiting activity      OT Treatment/Interventions: Self-care/ADL training;Therapeutic exercise;Patient/family education;Energy conservation;DME and/or AE instruction;Therapeutic activities;Balance training    OT Goals(Current goals can be found in the care plan section) Acute Rehab OT Goals Patient Stated Goal: " I want to go home" OT Goal Formulation:  With patient/family Time For Goal Achievement: 11/14/20 Potential to Achieve Goals: Good ADL Goals Pt Will Perform Grooming: with modified independence;standing Pt Will Transfer to Toilet: with modified independence;ambulating Pt Will Perform Toileting - Clothing Manipulation and hygiene: with modified independence;sit to/from stand Pt Will Perform Tub/Shower Transfer: with supervision;shower seat;ambulating;rolling walker  OT Frequency: Min 2X/week   Barriers to D/C:    lives alone          AM-PAC OT "6 Clicks" Daily Activity     Outcome Measure Help from another person eating meals?: None Help from another person taking care of personal grooming?: A Little Help from another person toileting, which includes using toliet, bedpan, or urinal?: A Lot Help from another person bathing (including washing, rinsing, drying)?: A Lot Help from another person to put on and taking off regular upper body clothing?: A Little Help from another person to put on and taking off regular lower body clothing?: A Lot 6 Click Score: 16   End of Session Equipment Utilized During Treatment: Rolling walker Nurse Communication: Mobility status;Other (comment) (bleeding with mobility)  Activity Tolerance: Patient limited by fatigue Patient left: in bed;with call bell/phone within reach;with bed alarm set;with family/visitor present  OT Visit Diagnosis: Unsteadiness on  feet (R26.81);Muscle weakness (generalized) (M62.81)                Time: 6834-1962 OT Time Calculation (min): 36 min Charges:  OT General Charges $OT Visit: 1 Visit OT Evaluation $OT Eval Moderate Complexity: 1 Mod OT Treatments $Self Care/Home Management : 23-37 mins   Jackquline Denmark, MS, OTR/L , CBIS ascom [phone]  10/31/20, 12:38 PM   10/31/2020, 12:37 PM

## 2020-11-01 LAB — GLUCOSE, CAPILLARY
Glucose-Capillary: 212 mg/dL — ABNORMAL HIGH (ref 70–99)
Glucose-Capillary: 211 mg/dL — ABNORMAL HIGH (ref 70–99)
Glucose-Capillary: 288 mg/dL — ABNORMAL HIGH (ref 70–99)
Glucose-Capillary: 194 mg/dL — ABNORMAL HIGH (ref 70–99)

## 2020-11-01 LAB — COMPREHENSIVE METABOLIC PANEL
ALT: 8 U/L (ref 0–44)
AST: 17 U/L (ref 15–41)
Albumin: 1.9 g/dL — ABNORMAL LOW (ref 3.5–5.0)
Alkaline Phosphatase: 101 U/L (ref 38–126)
Anion gap: 9 (ref 5–15)
BUN: 5 mg/dL — ABNORMAL LOW (ref 8–23)
CO2: 29 mmol/L (ref 22–32)
Calcium: 7.7 mg/dL — ABNORMAL LOW (ref 8.9–10.3)
Chloride: 102 mmol/L (ref 98–111)
Creatinine, Ser: 0.33 mg/dL — ABNORMAL LOW (ref 0.44–1.00)
GFR, Estimated: >60 mL/min (ref >60)
Glucose, Bld: 249 mg/dL — ABNORMAL HIGH (ref 70–99)
Potassium: 3.3 mmol/L — ABNORMAL LOW (ref 3.5–5.1)
Sodium: 140 mmol/L (ref 135–145)
Total Bilirubin: 0.3 mg/dL (ref 0.3–1.2)
Total Protein: 4.8 g/dL — ABNORMAL LOW (ref 6.5–8.1)

## 2020-11-01 LAB — CBC
HCT: 32.1 % — ABNORMAL LOW (ref 36.0–46.0)
Hemoglobin: 9.8 g/dL — ABNORMAL LOW (ref 12.0–15.0)
MCH: 24.9 pg — ABNORMAL LOW (ref 26.0–34.0)
MCHC: 30.5 g/dL (ref 30.0–36.0)
MCV: 81.7 fL (ref 80.0–100.0)
Platelets: 271 10*3/uL (ref 150–400)
RBC: 3.93 MIL/uL (ref 3.87–5.11)
RDW: 15.4 % (ref 11.5–15.5)
WBC: 8.4 10*3/uL (ref 4.0–10.5)
nRBC: 0 % (ref 0.0–0.2)

## 2020-11-01 LAB — MAGNESIUM: Magnesium: 1.9 mg/dL (ref 1.7–2.4)

## 2020-11-01 LAB — PHOSPHORUS: Phosphorus: 2.4 mg/dL — ABNORMAL LOW (ref 2.5–4.6)

## 2020-11-01 MED ORDER — POTASSIUM CHLORIDE 20 MEQ PO PACK
40.0000 meq | PACK | Freq: Once | ORAL | Status: AC
  Administered 2020-11-01: 40 meq via ORAL
  Filled 2020-11-01: qty 2

## 2020-11-01 MED ORDER — SODIUM CHLORIDE 0.9 % IV SOLN: INTRAVENOUS | Status: DC | PRN

## 2020-11-01 NOTE — Progress Notes (Signed)
Occupational Therapy Treatment Patient Details Name: Mindy Olson MRN: [id number] DOB: 1956/08/15 Today's Date: 11/01/2020    History of present illness 65 years old female with unknown medical history who presents with C/o:  Nausea, vomiting,  diarrhea and generalized weakness.  Patient has not seen a provider in several years.  Patient also reports daily heavy abnormal uterine bleeding and has not been evaluated.  In the ED she is found to have diabetic ketoacidosis and UTI.  CT abdomen showed diffuse enlargement of the uterus with necrosis consistent with endometrial neoplasm.  Patient is admitted for DKA and was started on insulin drip.  Anion gap closed. DKA has resolved.  Patient is transitioned to long-acting and short-acting insulin.  Patient is started on IV cefepime and Flagyl for sepsis secondary to UTI.  OB/GYN consulted.  Patient underwent endoscopic ultrasound and endometrial biopsy, findings consistent with endometrial cancer.   OT comments  Upon entering the room, pt supine in bed with daughter present in the room. Pt reports feeling much better today and smiling. Continued education on energy conservation at home and home set up discussion for safety at discharge. OT provided pt with level 2 theraband for B UE strengthening exercises. Pt performing 2 sets of 10 chest pulls, shoulder diagonals, shoulder flexion, and alternating punches. Pt taking rest breaks as needed and min cuing for proper technique. Pt very excited about returning home soon and equipment has been delivered to room. Handout provided with exercises and band left for pt to take home. All needs within reach upon exiting the room.   Follow Up Recommendations  Home health OT;Supervision/Assistance - 24 hour    Equipment Recommendations  3 in 1 bedside commode       Precautions / Restrictions Precautions Precautions: Fall Restrictions Weight Bearing Restrictions: No       Mobility Bed Mobility Overal bed  mobility: Needs Assistance Bed Mobility: Supine to Sit;Sit to Supine     Supine to sit: Supervision Sit to supine: Supervision   General bed mobility comments: increased time required to complete tasks. no physical assistance required  Transfers Overall transfer level: Needs assistance Equipment used: Rolling walker (2 wheeled) Transfers: Sit to/from Stand Sit to Stand: Min guard         General transfer comment: verbal cues for safety and technique.    Balance Overall balance assessment: Needs assistance Sitting-balance support: Feet supported Sitting balance-Leahy Scale: Good     Standing balance support: Bilateral upper extremity supported Standing balance-Leahy Scale: Poor Standing balance comment: patient relying on rolling walker for support in standing position                           ADL either performed or assessed with clinical judgement        Vision Patient Visual Report: No change from baseline            Cognition Arousal/Alertness: Awake/alert Behavior During Therapy: WFL for tasks assessed/performed Overall Cognitive Status: Within Functional Limits for tasks assessed                                                     Pertinent Vitals/ Pain       Pain Assessment: No/denies pain         Frequency  Min 2X/week  Progress Toward Goals  OT Goals(current goals can now be found in the care plan section)  Progress towards OT goals: Progressing toward goals  Acute Rehab OT Goals Patient Stated Goal: to go home OT Goal Formulation: With patient/family Time For Goal Achievement: 11/14/20 Potential to Achieve Goals: Good  Plan Discharge plan remains appropriate       AM-PAC OT "6 Clicks" Daily Activity     Outcome Measure   Help from another person eating meals?: None Help from another person taking care of personal grooming?: A Little Help from another person toileting, which includes using  toliet, bedpan, or urinal?: A Little Help from another person bathing (including washing, rinsing, drying)?: A Little Help from another person to put on and taking off regular upper body clothing?: A Little Help from another person to put on and taking off regular lower body clothing?: A Little 6 Click Score: 19    End of Session Equipment Utilized During Treatment: Rolling walker  OT Visit Diagnosis: Unsteadiness on feet (R26.81);Muscle weakness (generalized) (M62.81)   Activity Tolerance Patient limited by fatigue   Patient Left in bed;with call bell/phone within reach;with bed alarm set;with family/visitor present   Nurse Communication Mobility status        Time: 1530-1556 OT Time Calculation (min): 26 min  Charges: OT General Charges $OT Visit: 1 Visit OT Treatments $Therapeutic Activity: 8-22 mins $Therapeutic Exercise: 8-22 mins  Darleen Crocker, MS, OTR/L , CBIS ascom [phone]  11/01/20, 4:14 PM

## 2020-11-01 NOTE — Progress Notes (Signed)
PROGRESS NOTE    Mindy Olson  [id number] DOB: Apr 22, 1956 DOA: 10/28/2020 PCP: System, Provider Not In   Brief Narrative:  This 65 years old female with unknown medical history who presents with C/o:  Nausea, vomiting,  diarrhea and generalized weakness.  Patient has not seen a provider in several years.  Patient also reports daily heavy abnormal uterine bleeding and has not been evaluated. In the ED she is found to have diabetic ketoacidosis and UTI.  CT abdomen showed diffuse enlargement of the uterus with necrosis consistent with endometrial neoplasm. Patient is admitted for DKA and was started on insulin drip.  Anion gap closed. DKA has resolved.  Patient is transitioned to long-acting and short-acting insulin.  Patient is started on IV cefepime and Flagyl for sepsis secondary to UTI.  OB/GYN consulted.  Patient underwent endoscopic ultrasound and endometrial biopsy, findings consistent with endometrial cancer.  Blood cultures grew Streptococcus, Infectious disease consulted for antibiotic regimen at discharge.  Patient is going to have echocardiogram to rule out vegetation.  She might need TEE if TTE is negative.  Assessment & Plan:   Principal Problem:   DKA (diabetic ketoacidosis) ([place]) Active Problems:   Sepsis secondary to UTI ([place])   AKI (acute kidney injury) ([place])   Abnormal uterine and vaginal bleeding, unspecified   Elevated bilirubin   Protein-calorie malnutrition, severe   Severe sepsis secondary to UTI / Streptococci Bacteremia :   Patient presented with tachycardia, significant leukocytosis and lactate of 2.3 with positive UA She has initially received empiric cefepime and Flagyl in the ED. Antibiotic transitioned to ceftriaxone 2 g daily. Blood cultures positive for streptococci.. Infectious disease consulted for antibiotic recommendations. Lactic acid normalized with IV fluids. Antibiotics changed to Unasyn. Infectious disease recommended transthoracic  echocardiogram to rule out vegetation. She might need transesophageal echocardiogram if her TTE is negative.  DKA with newly diagnosed diabetes >> Improved Patient presented with glucose of 624 with anion gap of 39.  pH of 7.27, bicarb of 13.  Beta hydroxybutyrate greater than 8 Likely triggered by UTI.  Hemoglobin A1c 14.8 (uncontrolled)  -replete potassium as needed - continue insulin gtt with goal of 140-180 and AG <12 - IV NS until BG <250, then switch to D5 1/2 NS  - BMP q4hr  - keep NPO  -Anion gap closed.  Patient has been transitioned to subcu insulin. - Patient will be discharged on Lantus and Amaryl 4 mg daily  AKI >>> Resolved. pre-renal follow with repeat BMP while getting IV fluids  Postmenopausal abnormal uterine bleeding Patient has had symptoms for several years and has never seek evaluation Concerning for endometrial neoplasm GYN consulted.  Patient underwent endoscopic ultrasound with possible biopsy. Patient is well optimized for procedure. Hgb stable. Patient has outpatient appointment with OB/GYN.  Elevated bilirubin Elevated bilirubin of 3.2.  Possibly reactive due to DKA.  Patient has history of cholecystectomy.   DVT prophylaxis: SCDs Code Status: Full code Family Communication: No family at bedside Disposition Plan:  Status is: Inpatient  Remains inpatient appropriate because:Inpatient level of care appropriate due to severity of illness   Dispo: The patient is from: Home              Anticipated d/c is to: Home with Home services              Anticipated d/c date is: 1-2 days              Patient currently is not medically stable to d/c.  Difficult to place patient No   Consultants:   OB/GYN  Infectious disease  Procedures:  Antimicrobials:  Anti-infectives (From admission, onward)   Start     Dose/Rate Route Frequency Ordered Stop   10/30/20 1200  Ampicillin-Sulbactam (UNASYN) 3 g in sodium chloride 0.9 % 100 mL IVPB        3  g 200 mL/hr over 30 Minutes Intravenous Every 6 hours 10/29/20 1702     10/30/20 1000  cefTRIAXone (ROCEPHIN) 2 g in sodium chloride 0.9 % 100 mL IVPB  Status:  Discontinued        2 g 200 mL/hr over 30 Minutes Intravenous Every 24 hours 10/29/20 1046 10/29/20 1702   10/30/20 0000  cefTRIAXone (ROCEPHIN) 1 g in sodium chloride 0.9 % 100 mL IVPB  Status:  Discontinued        1 g 200 mL/hr over 30 Minutes Intravenous Every 24 hours 10/29/20 0014 10/29/20 1046   10/29/20 1830  metroNIDAZOLE (FLAGYL) IVPB 500 mg        500 mg 100 mL/hr over 60 Minutes Intravenous Every 8 hours 10/29/20 1702 10/30/20 0738   10/29/20 1800  cefTRIAXone (ROCEPHIN) 2 g in sodium chloride 0.9 % 100 mL IVPB        2 g 200 mL/hr over 30 Minutes Intravenous  Once 10/29/20 1702 10/29/20 1834   10/28/20 1930  ceFEPIme (MAXIPIME) 2 g in sodium chloride 0.9 % 100 mL IVPB        2 g 200 mL/hr over 30 Minutes Intravenous  Once 10/28/20 1919 10/28/20 2102   10/28/20 1930  metroNIDAZOLE (FLAGYL) IVPB 500 mg        500 mg 100 mL/hr over 60 Minutes Intravenous  Once 10/28/20 1919 10/28/20 2207      Subjective: Patient was seen and examined at bedside.  Overnight events noted.   Patient reports feeling generalized weakness, reports slightly improved from yesterday. She reports pain is better controlled,  denies any nausea and vomiting.  Objective: Vitals:   11/01/20 0020 11/01/20 0421 11/01/20 0724 11/01/20 1109  BP: 139/72 138/65 (!) 127/99 (!) 146/73  Pulse: 91 87 91 96  Resp: 20 17 17 18   Temp: 97.8 F (36.6 C) 98.2 F (36.8 C) 97.7 F (36.5 C) 97.8 F (36.6 C)  TempSrc: Oral Oral Oral   SpO2: 99% 99% 99% 98%  Weight:      Height:        Intake/Output Summary (Last 24 hours) at 11/01/2020 1207 Last data filed at 11/01/2020 0900 Gross per 24 hour  Intake 2781.01 ml  Output --  Net 2781.01 ml   Filed Weights   10/28/20 1733  Weight: 50.8 kg    Examination:  General exam: Appears calm and comfortable,  not in any acute distress. Respiratory system: Clear to auscultation. Respiratory effort normal. Cardiovascular system: S1 & S2 heard, RRR. No JVD, murmurs, rubs, gallops or clicks. No pedal edema. Gastrointestinal system: Abdomen is nondistended, soft and nontender. No organomegaly or masses felt. Normal bowel sounds heard. Central nervous system: Alert and oriented. No focal neurological deficits. Extremities: Symmetric 5 x 5 power. Skin: No rashes, lesions or ulcers Psychiatry: Judgement and insight appear normal. Mood & affect appropriate.     Data Reviewed: I have personally reviewed following labs and imaging studies  CBC: Recent Labs  Lab 10/28/20 1737 10/29/20 1219 10/30/20 0553 10/31/20 0546 11/01/20 0444  WBC 39.8*  --  19.9* 12.0* 8.4  NEUTROABS 36.7*  --   --   --   --  HGB 12.6 11.1* 10.7* 9.6* 9.8*  HCT 40.6 35.2* 34.0* 30.6* 32.1*  MCV 81.0  --  79.4* 80.1 81.7  PLT 544*  --  311 264 [id number]   Basic Metabolic Panel: Recent Labs  Lab 10/29/20 1219 10/29/20 1500 10/30/20 0553 10/31/20 0546 11/01/20 0444  NA 145 144 143 140 140  K 3.1* 3.7 3.8 3.4* 3.3*  CL 103 105 103 103 102  CO2 28 28 26 28 29   GLUCOSE 198* 192* 229* 187* 249*  BUN 16 12 11 8  5*  CREATININE 0.47 0.39* 0.39* 0.34* 0.33*  CALCIUM 9.3 8.7* 8.6* 7.8* 7.7*  MG  --   --  1.9 1.5* 1.9  PHOS  --   --  1.6* 2.2* 2.4*   GFR: Estimated Creatinine Clearance: 56.2 mL/min (A) (by C-G formula based on SCr of 0.33 mg/dL (L)). Liver Function Tests: Recent Labs  Lab 10/28/20 1737 10/30/20 0553 11/01/20 0444  AST 16 17 17   ALT 14 12 8   ALKPHOS 217* 130* 101  BILITOT 3.2* 0.9 0.3  PROT 6.9 5.2* 4.8*  ALBUMIN 2.7* 2.0* 1.9*   No results for input(s): LIPASE, AMYLASE in the last 168 hours. No results for input(s): AMMONIA in the last 168 hours. Coagulation Profile: No results for input(s): INR, PROTIME in the last 168 hours. Cardiac Enzymes: No results for input(s): CKTOTAL, CKMB, CKMBINDEX,  TROPONINI in the last 168 hours. BNP (last 3 results) No results for input(s): PROBNP in the last 8760 hours. HbA1C: No results for input(s): HGBA1C in the last 72 hours. CBG: Recent Labs  Lab 10/31/20 0746 10/31/20 1215 10/31/20 1654 10/31/20 2118 11/01/20 0727  GLUCAP 161* 318* 292* 213* 212*   Lipid Profile: No results for input(s): CHOL, HDL, LDLCALC, TRIG, CHOLHDL, LDLDIRECT in the last 72 hours. Thyroid Function Tests: No results for input(s): TSH, T4TOTAL, FREET4, T3FREE, THYROIDAB in the last 72 hours. Anemia Panel: No results for input(s): VITAMINB12, FOLATE, FERRITIN, TIBC, IRON, RETICCTPCT in the last 72 hours. Sepsis Labs: Recent Labs  Lab 10/28/20 1834 10/28/20 2043 10/29/20 0116 10/29/20 0314  LATICACIDVEN 2.2* 2.3* 2.1* 1.7    Recent Results (from the past 240 hour(s))  SARS Coronavirus 2 by RT PCR (hospital order, performed in [facility] hospital lab) Nasopharyngeal Nasopharyngeal Swab     Status: None   Collection Time: 10/28/20  6:44 PM   Specimen: Nasopharyngeal Swab  Result Value Ref Range Status   SARS Coronavirus 2 NEGATIVE NEGATIVE Final    Comment: (NOTE) SARS-CoV-2 target nucleic acids are NOT DETECTED.  The SARS-CoV-2 RNA is generally detectable in upper and lower respiratory specimens during the acute phase of infection. The lowest concentration of SARS-CoV-2 viral copies this assay can detect is 250 copies / mL. A negative result does not preclude SARS-CoV-2 infection and should not be used as the sole basis for treatment or other patient management decisions.  A negative result may occur with improper specimen collection / handling, submission of specimen other than nasopharyngeal swab, presence of viral mutation(s) within the areas targeted by this assay, and inadequate number of viral copies (<250 copies / mL). A negative result must be combined with clinical observations, patient history, and epidemiological information.  Fact Sheet  for Patients:   StrictlyIdeas.no  Fact Sheet for Healthcare Providers: BankingDealers.co.za  This test is not yet approved or  cleared by the [place] FDA and has been authorized for detection and/or diagnosis of SARS-CoV-2 by FDA under an Emergency Use Authorization (EUA).  This EUA  will remain in effect (meaning this test can be used) for the duration of the COVID-19 declaration under Section 564(b)(1) of the Act, 21 U.S.C. section 360bbb-3(b)(1), unless the authorization is terminated or revoked sooner.  Performed at [facility], [address] [place], [place] [address]   Blood culture (single)     Status: Abnormal   Collection Time: 10/28/20  7:26 PM   Specimen: BLOOD  Result Value Ref Range Status   Specimen Description   Final    BLOOD RIGHT ANTECUBITAL Performed at [facility], [address], [place], [place] [address]    Special Requests   Final    BOTTLES DRAWN AEROBIC AND ANAEROBIC Blood Culture adequate volume Performed at [facility], [place], [place] [address]    Culture  Setup Time   Final    GRAM POSITIVE COCCI IN BOTH AEROBIC AND ANAEROBIC BOTTLES CRITICAL RESULT CALLED TO, READ BACK BY AND VERIFIED WITH: S.HALLAJI,PHARMD AT 1008 ON 10/29/20 BY GM Performed at [facility], [place] [address] [place], [place] [address]    Culture STREPTOCOCCUS SALIVARIUS (A)  Final   Report Status 10/31/2020 FINAL  Final   Organism ID, Bacteria STREPTOCOCCUS SALIVARIUS  Final      Susceptibility   Streptococcus salivarius - MIC*    PENICILLIN 0.12 SENSITIVE Sensitive     CEFTRIAXONE <=0.12 SENSITIVE Sensitive     ERYTHROMYCIN <=0.12 SENSITIVE Sensitive     LEVOFLOXACIN 4 INTERMEDIATE Intermediate     VANCOMYCIN 1 SENSITIVE Sensitive     * STREPTOCOCCUS SALIVARIUS  Blood Culture ID Panel (Reflexed)     Status: Abnormal   Collection Time: 10/28/20  7:26 PM  Result  Value Ref Range Status   Enterococcus faecalis NOT DETECTED NOT DETECTED Final   Enterococcus Faecium NOT DETECTED NOT DETECTED Final   Listeria monocytogenes NOT DETECTED NOT DETECTED Final   Staphylococcus species NOT DETECTED NOT DETECTED Final   Staphylococcus aureus (BCID) NOT DETECTED NOT DETECTED Final   Staphylococcus epidermidis NOT DETECTED NOT DETECTED Final   Staphylococcus lugdunensis NOT DETECTED NOT DETECTED Final   Streptococcus species DETECTED (A) NOT DETECTED Final    Comment: Not Enterococcus species, Streptococcus agalactiae, Streptococcus pyogenes, or Streptococcus pneumoniae. CRITICAL RESULT CALLED TO, READ BACK BY AND VERIFIED WITH: S.HALLAJI,PHARMD AT 1008 ON 10/29/20 BY GM    Streptococcus agalactiae NOT DETECTED NOT DETECTED Final   Streptococcus pneumoniae NOT DETECTED NOT DETECTED Final   Streptococcus pyogenes NOT DETECTED NOT DETECTED Final   A.calcoaceticus-baumannii NOT DETECTED NOT DETECTED Final   Bacteroides fragilis NOT DETECTED NOT DETECTED Final   Enterobacterales NOT DETECTED NOT DETECTED Final   Enterobacter cloacae complex NOT DETECTED NOT DETECTED Final   Escherichia coli NOT DETECTED NOT DETECTED Final   Klebsiella aerogenes NOT DETECTED NOT DETECTED Final   Klebsiella oxytoca NOT DETECTED NOT DETECTED Final   Klebsiella pneumoniae NOT DETECTED NOT DETECTED Final   Proteus species NOT DETECTED NOT DETECTED Final   Salmonella species NOT DETECTED NOT DETECTED Final   Serratia marcescens NOT DETECTED NOT DETECTED Final   Haemophilus influenzae NOT DETECTED NOT DETECTED Final   Neisseria meningitidis NOT DETECTED NOT DETECTED Final   Pseudomonas aeruginosa NOT DETECTED NOT DETECTED Final   Stenotrophomonas maltophilia NOT DETECTED NOT DETECTED Final   Candida albicans NOT DETECTED NOT DETECTED Final   Candida auris NOT DETECTED NOT DETECTED Final   Candida glabrata NOT DETECTED NOT DETECTED Final   Candida krusei NOT DETECTED NOT DETECTED  Final  Candida parapsilosis NOT DETECTED NOT DETECTED Final   Candida tropicalis NOT DETECTED NOT DETECTED Final   Cryptococcus neoformans/gattii NOT DETECTED NOT DETECTED Final    Comment: Performed at [facility], [place], [address] [place], [place] [address]  Culture, blood (single)     Status: None (Preliminary result)   Collection Time: 10/28/20  8:43 PM   Specimen: BLOOD  Result Value Ref Range Status   Specimen Description BLOOD BLOOD RIGHT WRIST  Final   Special Requests   Final    BOTTLES DRAWN AEROBIC AND ANAEROBIC Blood Culture adequate volume   Culture   Final    NO GROWTH 4 DAYS Performed at [facility], [address] [place], [place] [address]    Report Status PENDING  Incomplete  CULTURE, BLOOD (ROUTINE X 2) w Reflex to ID Panel     Status: None (Preliminary result)   Collection Time: 10/30/20 11:43 PM   Specimen: BLOOD  Result Value Ref Range Status   Specimen Description BLOOD BLOOD RIGHT HAND  Final   Special Requests   Final    BOTTLES DRAWN AEROBIC AND ANAEROBIC Blood Culture adequate volume   Culture   Final    NO GROWTH 2 DAYS Performed at [facility], [address] [place], [place] [address]    Report Status PENDING  Incomplete  CULTURE, BLOOD (ROUTINE X 2) w Reflex to ID Panel     Status: None (Preliminary result)   Collection Time: 10/30/20 11:43 PM   Specimen: BLOOD  Result Value Ref Range Status   Specimen Description BLOOD BLOOD LEFT HAND  Final   Special Requests   Final    BOTTLES DRAWN AEROBIC AND ANAEROBIC Blood Culture adequate volume   Culture   Final    NO GROWTH 2 DAYS Performed at [facility], [address], [place], [place] [address]    Report Status PENDING  Incomplete     Radiology Studies: No results found. Scheduled Meds: . feeding supplement  237 mL Oral TID BM  . insulin aspart  0-15 Units Subcutaneous TID WC  . insulin glargine  10 Units Subcutaneous Daily  . phosphorus  250 mg  Oral TID  . potassium chloride  40 mEq Oral Once   Continuous Infusions: . sodium chloride    . ampicillin-sulbactam (UNASYN) IV 3 g (11/01/20 1105)     LOS: 4 days    Time spent: 25 mins    Alsha Meland, MD [facility]   If 7PM-7AM, please contact night-coverage

## 2020-11-01 NOTE — Progress Notes (Signed)
   Date of Admission:  10/28/2020      ID: Mindy Olson is a 65 y.o. female  Principal Problem:   DKA (diabetic ketoacidosis) ([place]) Active Problems:   Sepsis secondary to UTI ([place])   AKI (acute kidney injury) ([place])   Abnormal uterine and vaginal bleeding, unspecified   Elevated bilirubin   Protein-calorie malnutrition, severe    Subjective: Patient says she worked with physical therapist. Anxious about being told to have a 2D echo. No fever  Medications:  . feeding supplement  237 mL Oral TID BM  . insulin aspart  0-15 Units Subcutaneous TID WC  . insulin glargine  10 Units Subcutaneous Daily  . phosphorus  250 mg Oral TID    Objective: Vital signs in last 24 hours: Temp:  [97.7 F (36.5 C)-99.2 F (37.3 C)] 99.2 F (37.3 C) (02/03 1509) Pulse Rate:  [87-110] 110 (02/03 1509) Resp:  [16-20] 18 (02/03 1509) BP: (118-146)/(57-99) 125/57 (02/03 1509) SpO2:  [98 %-99 %] 98 % (02/03 1509)  PHYSICAL EXAM:  General: Alert, cooperative, no distress, appears stated age.  Pale Head: Normocephalic, without obvious abnormality, atraumatic. Eyes: Conjunctivae clear, anicteric sclerae. Pupils are equal ENT Nares normal. No drainage or sinus tenderness. Lips, mucosa, and tongue normal. No Thrush Neck: Supple, symmetrical, no adenopathy, thyroid: non tender no carotid bruit and no JVD. Back: No CVA tenderness. Lungs: Clear to auscultation bilaterally. No Wheezing or Rhonchi. No rales. Heart: Regular rate and rhythm, no murmur, rub or gallop. Abdomen: Soft, palpable uterus above the pubis Extremities: atraumatic, no cyanosis. No edema. No clubbing Skin: No rashes or lesions. Or bruising Lymph: Cervical, supraclavicular normal. Neurologic: Grossly non-focal  Lab Results Recent Labs    10/31/20 0546 11/01/20 0444  WBC 12.0* 8.4  HGB 9.6* 9.8*  HCT 30.6* 32.1*  NA 140 140  K 3.4* 3.3*  CL 103 102  CO2 28 29  BUN 8 5*  CREATININE 0.34* 0.33*   Liver  Panel Recent Labs    10/30/20 0553 11/01/20 0444  PROT 5.2* 4.8*  ALBUMIN 2.0* 1.9*  AST 17 17  ALT 12 8  ALKPHOS 130* 101  BILITOT 0.9 0.3   Sedimentation Rate No results for input(s): ESRSEDRATE in the last 72 hours. C-Reactive Protein No results for input(s): CRP in the last 72 hours.  Microbiology:  Studies/Results: No results found.   Assessment/Plan:  Necrotic uterine tumor with leukocytosis.  Status post EUA,/D&C and biopsy. On Unasyn WBC count is reduced from 39-12.6  Strep cellulitis bacteremia.  This organism is an oral organism usually not expected to cause endometrial infection.  Hence we will get 2D echo to make sure no vegetation.  Currently on Unasyn.  We will switch to oral antibiotic if 2D echo is negative for vegetation.  DKA.  On admission.  No history of diabetes in the past.  Currently on insulin  Discussed the management with the patient in detail.

## 2020-11-01 NOTE — Progress Notes (Signed)
Physical Therapy Treatment Patient Details Name: Mindy Olson MRN: [id number] DOB: [birth year] Today's Date: 11/01/2020    History of Present Illness 65 years old female with unknown medical history who presents with C/o:  Nausea, vomiting,  diarrhea and generalized weakness.  Patient has not seen a provider in several years.  Patient also reports daily heavy abnormal uterine bleeding and has not been evaluated.  In the ED she is found to have diabetic ketoacidosis and UTI.  CT abdomen showed diffuse enlargement of the uterus with necrosis consistent with endometrial neoplasm.  Patient is admitted for DKA and was started on insulin drip.  Anion gap closed. DKA has resolved.  Patient is transitioned to long-acting and short-acting insulin.  Patient is started on IV cefepime and Flagyl for sepsis secondary to UTI.  OB/GYN consulted.  Patient underwent endoscopic ultrasound and endometrial biopsy, findings consistent with endometrial cancer.    PT Comments    Patient agreeable to PT. Patient is eager to be discharged home soon. Patient with improvement in activity tolerance and increased independence with activity this session. Patient declined stair training due to feeling anxious about the thought of this. Heart rate does increase into the 130's with Sp02 95% on room air. Educated patient on techniques on progressing activity/endurance and using rolling walker for safety and fall prevention at home.  Recommend to continue PT to maximize independence and address remaining functional limitations. HHPT recommended at discharge.     Follow Up Recommendations  Home health PT     Equipment Recommendations   (DME has been delivered to room (RW and 3in1)    Recommendations for Other Services       Precautions / Restrictions Precautions Precautions: Fall Restrictions Weight Bearing Restrictions: No    Mobility  Bed Mobility Overal bed mobility: Needs Assistance Bed Mobility: Supine to  Sit;Sit to Supine     Supine to sit: Supervision Sit to supine: Supervision   General bed mobility comments: increased time required to complete tasks. no physical assistance required  Transfers Overall transfer level: Needs assistance Equipment used: Rolling walker (2 wheeled) Transfers: Sit to/from Stand Sit to Stand: Min guard         General transfer comment: verbal cues for safety and technique.  Ambulation/Gait Ambulation/Gait assistance: Supervision Gait Distance (Feet): 100 Feet Assistive device: Rolling walker (2 wheeled) Gait Pattern/deviations: Step-through pattern Gait velocity: decreased   General Gait Details: patient ambulated multiple laps in the room (she declined hallway ambulation) and navigated around obstacles with supervision without loss of balance. Sp02 95 % on room air and heart rate in the 130s after walking.   Stairs Stairs:  (patient declined stair training and remains anxious about this. states she might try before discharge home)           Wheelchair Mobility    Modified Rankin (Stroke Patients Only)       Balance Overall balance assessment: Needs assistance Sitting-balance support: Feet supported Sitting balance-Leahy Scale: Good     Standing balance support: Bilateral upper extremity supported Standing balance-Leahy Scale: Poor Standing balance comment: patient relying on rolling walker for support in standing position                            Cognition Arousal/Alertness: Awake/alert Behavior During Therapy: WFL for tasks assessed/performed Overall Cognitive Status: Within Functional Limits for tasks assessed  Exercises      General Comments        Pertinent Vitals/Pain Pain Assessment: No/denies pain    Home Living                      Prior Function            PT Goals (current goals can now be found in the care plan section)  Acute Rehab PT Goals Patient Stated Goal: to go home PT Goal Formulation: With patient Time For Goal Achievement: 11/14/20 Potential to Achieve Goals: Good Progress towards PT goals: Progressing toward goals    Frequency    Min 2X/week      PT Plan Current plan remains appropriate    Co-evaluation              AM-PAC PT "6 Clicks" Mobility   Outcome Measure  Help needed turning from your back to your side while in a flat bed without using bedrails?: None Help needed moving from lying on your back to sitting on the side of a flat bed without using bedrails?: A Little Help needed moving to and from a bed to a chair (including a wheelchair)?: A Little Help needed standing up from a chair using your arms (e.g., wheelchair or bedside chair)?: A Little Help needed to walk in hospital room?: A Little Help needed climbing 3-5 steps with a railing? : A Little 6 Click Score: 19    End of Session Equipment Utilized During Treatment: Gait belt Activity Tolerance: Patient tolerated treatment well Patient left: in bed;with call bell/phone within reach;with family/visitor present Nurse Communication:  (white board up to date with moblity status) PT Visit Diagnosis: Muscle weakness (generalized) (M62.81);Difficulty in walking, not elsewhere classified (R26.2)     Time: 7619-5093 PT Time Calculation (min) (ACUTE ONLY): 24 min  Charges:  $Therapeutic Activity: 23-37 mins                     Mindy Olson, PT, MPT    Mindy Olson 11/01/2020, 2:59 PM

## 2020-11-01 NOTE — Progress Notes (Signed)
Inpatient Diabetes Program Recommendations  AACE/ADA: New Consensus Statement on Inpatient Glycemic Control (2015)  Target Ranges:  Prepandial:   less than 140 mg/dL      Peak postprandial:   less than 180 mg/dL (1-2 hours)      Critically ill patients:  140 - 180 mg/dL   Lab Results  Component Value Date   GLUCAP 212 (H) 11/01/2020   HGBA1C 14.8 (H) 10/28/2020    Review of Glycemic Control  Inpatient Diabetes Program Recommendations:   -Add Amaryl 2 mg qd as appropriate and for diischarge along with Lantus 10 units qd with followup with PCP.  Thank you, Nani Gasser. Hanifah Royse, RN, MSN, CDE  Diabetes Coordinator Inpatient Glycemic Control Team Team Pager [phone] (8am-5pm) 11/01/2020 9:48 AM

## 2020-11-01 NOTE — Care Management Important Message (Signed)
Important Message  Patient Details  Name: Mindy Olson MRN: [id number] Date of Birth: [birth year]   Medicare Important Message Given:  N/A - LOS <3 / Initial given by admissions     [employer] 11/01/2020, 10:21 AM

## 2020-11-02 ENCOUNTER — Inpatient Hospital Stay (HOSPITAL_COMMUNITY)
Admit: 2020-11-02 | Discharge: 2020-11-02 | Disposition: A | Discharge status: Home / Self Care | Payer: Medicare Other | Attending: Infectious Diseases | Admitting: Infectious Diseases

## 2020-11-02 DIAGNOSIS — R7881 Bacteremia: Secondary | ICD-10-CM

## 2020-11-02 LAB — GLUCOSE, CAPILLARY
Glucose-Capillary: 238 mg/dL — ABNORMAL HIGH (ref 70–99)
Glucose-Capillary: 282 mg/dL — ABNORMAL HIGH (ref 70–99)

## 2020-11-02 LAB — ECHOCARDIOGRAM COMPLETE
AR max vel: 2.43 cm2
AV Area VTI: 2.49 cm2
AV Area mean vel: 2.18 cm2
AV Mean grad: 3 mmHg
AV Peak grad: 5 mmHg
Ao pk vel: 1.12 m/s
Area-P 1/2: 7.82 cm2
Height: 64 in
S' Lateral: 1.96 cm
Weight: 1792 oz

## 2020-11-02 LAB — BASIC METABOLIC PANEL
Anion gap: 11 (ref 5–15)
BUN: 7 mg/dL — ABNORMAL LOW (ref 8–23)
CO2: 28 mmol/L (ref 22–32)
Calcium: 7.9 mg/dL — ABNORMAL LOW (ref 8.9–10.3)
Chloride: 98 mmol/L (ref 98–111)
Creatinine, Ser: 0.31 mg/dL — ABNORMAL LOW (ref 0.44–1.00)
GFR, Estimated: >60 mL/min (ref >60)
Glucose, Bld: 263 mg/dL — ABNORMAL HIGH (ref 70–99)
Potassium: 3.7 mmol/L (ref 3.5–5.1)
Sodium: 137 mmol/L (ref 135–145)

## 2020-11-02 LAB — CULTURE, BLOOD (SINGLE)
Culture: NO GROWTH
Special Requests: ADEQUATE

## 2020-11-02 MED ORDER — INSULIN ASPART 100 UNIT/ML FLEXPEN
5.0000 [IU] | PEN_INJECTOR | Freq: Three times a day (TID) | SUBCUTANEOUS | 1 refills | Status: DC
Start: 2020-11-02 — End: 2023-01-10

## 2020-11-02 MED ORDER — LANTUS SOLOSTAR 100 UNIT/ML ~~LOC~~ SOPN: 10.0000 [IU] | PEN_INJECTOR | Freq: Every day | SUBCUTANEOUS | 2 refills | Status: DC

## 2020-11-02 MED ORDER — AMOXICILLIN-POT CLAVULANATE 875-125 MG PO TABS: 1.0000 | ORAL_TABLET | Freq: Two times a day (BID) | ORAL | 0 refills | Status: AC

## 2020-11-02 MED ORDER — GLIMEPIRIDE 4 MG PO TABS: 4.0000 mg | ORAL_TABLET | Freq: Every day | ORAL | 1 refills | Status: AC

## 2020-11-02 NOTE — Anesthesia Postprocedure Evaluation (Signed)
Anesthesia Post Note  Patient: Mindy Olson  Procedure(s) Performed: Fractional DILATATION & CURETTAGE/HYSTEROSCOPY with Biopsy (N/A )  Patient location during evaluation: PACU Anesthesia Type: General Level of consciousness: awake and alert and oriented Pain management: pain level controlled Vital Signs Assessment: post-procedure vital signs reviewed and stable Respiratory status: spontaneous breathing Cardiovascular status: blood pressure returned to baseline Anesthetic complications: no   No complications documented.   Last Vitals:  Vitals:   11/02/20 0000 11/02/20 0348  BP: 127/72 132/83  Pulse: 85 90  Resp: 18 20  Temp: 36.8 C 36.6 C  SpO2: 98% 99%    Last Pain:  Vitals:   11/02/20 0000  TempSrc: Oral  PainSc:                  Artha Chiasson

## 2020-11-02 NOTE — Discharge Instructions (Signed)
Advised to follow-up with primary care physician in 1 week. Advised to follow-up with gynecologist as scheduled.  Appointment has been made. Advised to take Augmentin twice a day for 14 days for the Streptococcus bacteremia.   Advised to take Lantus and NovoLog as advised.   We will request primary care physician to refer her to endocrinologist for diabetic management.   Fingerstick glucose (sugar) goals for home: Before meals: 80-130 mg/dl 2-Hours after meals: less than 180 mg/dl Hemoglobin A1c goal: 7% or less  Insulin Pen Instructions:  1. Remove Insulin pen cap and clean pen 1st with alcohol rub and then clean skin 2nd with alcohol rub 2. Twist insulin pen needle onto pen (right tighty) 3. Remove outer cap and inner cap from needle 4. Dial pen to 2 units and perform prime- press pen to zero and make sure liquid (insulin) comes out of the needle 5. Dial pen to your dose and perform injection into your abdomen 6. Hold needle in skin for 10 seconds after injection 7. Remove needle from insulin pen and discard 8. Place cap back on insulin pen and store safely (at room temperature) 9. Store unused pens in refrigerator and can keep opened insulin pen at room temperature (discard used pen after 30 days)

## 2020-11-02 NOTE — Progress Notes (Signed)
Patient is stable and ready for discharge home with daughter. Patient's IV removed. Writer went over discharge paperwork with patient and daughter and both verbalized understanding. Patient's daughter packed patient's belonging and assisted patient with getting dressed. Patient's 3 in 1 and RW did go with patient home. NT transported patient to her daughter car.

## 2020-11-02 NOTE — Care Management Important Message (Signed)
Important Message  Patient Details  Name: Mindy Olson MRN: [id number] Date of Birth: Dec 25, 1955   Medicare Important Message Given:  N/A - LOS <3 / Initial given by admissions     [name] 11/02/2020, 9:08 AM

## 2020-11-02 NOTE — Progress Notes (Signed)
*  PRELIMINARY RESULTS* Echocardiogram 2D Echocardiogram has been performed.  Sherrie Sport 11/02/2020, 9:53 AM

## 2020-11-02 NOTE — Progress Notes (Signed)
Physical Therapy Treatment Patient Details Name: Mindy Olson MRN: [id number] DOB: [birth year] Today's Date: 11/02/2020    History of Present Illness 65 years old female with unknown medical history who presents with C/o:  Nausea, vomiting,  diarrhea and generalized weakness.  Patient has not seen a provider in several years.  Patient also reports daily heavy abnormal uterine bleeding and has not been evaluated.  In the ED she is found to have diabetic ketoacidosis and UTI.  CT abdomen showed diffuse enlargement of the uterus with necrosis consistent with endometrial neoplasm.  Patient is admitted for DKA and was started on insulin drip.  Anion gap closed. DKA has resolved.  Patient is transitioned to long-acting and short-acting insulin.  Patient is started on IV cefepime and Flagyl for sepsis secondary to UTI.  OB/GYN consulted.  Patient underwent endoscopic ultrasound and endometrial biopsy, findings consistent with endometrial cancer.    PT Comments    The pt is excited to participate in stair negotiation training this session. Her daughter is present and educated on how to help the patient safely navigate stairs. At this time the pt is safe for d/c home with family care and  HHPT once medically stable. PT will continue to follow up.    Follow Up Recommendations  Home health PT     Equipment Recommendations       Recommendations for Other Services       Precautions / Restrictions Precautions Precautions: Fall    Mobility  Bed Mobility Overal bed mobility: Independent                Transfers Overall transfer level: Modified independent Equipment used: Rolling walker (2 wheeled)                Ambulation/Gait   Gait Distance (Feet): 50 Feet             Stairs Stairs: Yes Stairs assistance: Min guard Stair Management: Two rails Number of Stairs: 6     Wheelchair Mobility    Modified Rankin (Stroke Patients Only)       Balance Overall  balance assessment: Needs assistance Sitting-balance support: Feet supported Sitting balance-Leahy Scale: Good     Standing balance support: Bilateral upper extremity supported Standing balance-Leahy Scale: Reading                              Cognition                                              Exercises      General Comments        Pertinent Vitals/Pain      Home Living                      Prior Function            PT Goals (current goals can now be found in the care plan section) Acute Rehab PT Goals Patient Stated Goal: to go home PT Goal Formulation: With patient Time For Goal Achievement: 11/14/20 Potential to Achieve Goals: Good Progress towards PT goals: Progressing toward goals    Frequency    Min 2X/week      PT Plan Current plan remains appropriate    Co-evaluation  AM-PAC PT "6 Clicks" Mobility   Outcome Measure  Help needed turning from your back to your side while in a flat bed without using bedrails?: None Help needed moving from lying on your back to sitting on the side of a flat bed without using bedrails?: None Help needed moving to and from a bed to a chair (including a wheelchair)?: A Little Help needed standing up from a chair using your arms (e.g., wheelchair or bedside chair)?: A Little Help needed to walk in hospital room?: A Little Help needed climbing 3-5 steps with a railing? : A Little 6 Click Score: 20    End of Session   Activity Tolerance: Patient tolerated treatment well Patient left: in bed;with family/visitor present;with nursing/sitter in room Nurse Communication: Mobility status PT Visit Diagnosis: Muscle weakness (generalized) (M62.81);Difficulty in walking, not elsewhere classified (R26.2)     Time: 1340-1403 PT Time Calculation (min) (ACUTE ONLY): 23 min  Charges:  $Gait Training: 23-37 mins                    3:13 PM, 11/02/20 Irlene Crudup A. Saverio Danker  PT, DPT Physical Therapist - [facility]    Eames Dibiasio A Chip Canepa 11/02/2020, 3:11 PM

## 2020-11-02 NOTE — Discharge Summary (Addendum)
Physician Discharge Summary  Mindy Olson [facility] OFB:[id number] DOB: 1956/03/12 DOA: 10/28/2020  PCP: System, Provider Not In  Admit date: 10/28/2020   Discharge date: 11/02/2020  Admitted From: Home.  Disposition:   Home with Home services.  Recommendations for Outpatient Follow-up:  1. Follow up with PCP in 1-2 weeks. 2. Please obtain BMP/CBC in one week. 3. Advised to follow-up with Gynecologist as scheduled.  Appointment has been made. 4. Advised to take Augmentin twice a day for 14 days for the Streptococcus bacteremia.   5. Advised to take Lantus and NovoLog as advised.  Amaryl 4 mg daily.  6. We will request primary care physician to refer her to endocrinologist for diabetic management.  Home Health:  Yes. Home PT/OT  Equipment/Devices: RW, (3 in 1)  Discharge Condition: Stable. CODE STATUS:Full code Diet recommendation: Heart Healthy   Brief Summary / Hospital Course: This 65 years old female with unknown medical history who presents with C/o:  Nausea, vomiting,  diarrhea and generalized weakness.  Patient has not seen a provider in several years.  Patient also reports daily heavy abnormal uterine bleeding and has not been evaluated. In the ED She is found to have diabetic ketoacidosis and UTI.  CT abdomen showed diffuse enlargement of the uterus with necrosis consistent with endometrial neoplasm. Patient was admitted for DKA (diabetic ketoacidosis ) and was started on insulin drip. She was continued on IV fluids as per protocol. Anion gap closed. DKA has resolved. Patient is transitioned to long-acting and short-acting insulin.  Patient was started on IV cefepime and Flagyl for sepsis secondary to UTI.  OB/GYN consulted.  Patient underwent endoscopic ultrasound and endometrial biopsy under anesthesia. Biopsy findings consistent with endometrial cancer.  Bleeding has stopped,  Patient needs outpatient follow-up for elective hysterectomy. Blood cultures grew Streptococcus, Infectious  disease consulted for antibiotic regimen at discharge.  Patient underwent transthoracic echocardiogram, LVEF 60 to 65%. No vegetations were found,  valves appears clean and clear.   Discussed with infectious diseases, agreed with discharge on Augmentin 875 mg twice daily for 14 days with probiotics.  Patient will follow up with primary care physician in 1 week.  Appointment has been made with gynecologist Dr. Laverta Baltimore.  Patient has participated in physical therapy,  recommended home PT and OT which has been arranged.  She was managed for below problems.   Discharge Diagnoses:  Principal Problem:   DKA (diabetic ketoacidosis) ([place]) Active Problems:   Sepsis secondary to UTI ([place])   AKI (acute kidney injury) ([place])   Abnormal uterine and vaginal bleeding, unspecified   Elevated bilirubin   Protein-calorie malnutrition, severe  Severe sepsis secondary to UTI / Streptococci Bacteremia :   Patient presented with tachycardia, significant leukocytosis and lactate of 2.3 with positive UA She has initially received empiric cefepime and Flagyl in the ED. Antibiotic transitioned to ceftriaxone 2 g daily. Blood cultures positive for streptococci.. Infectious disease consulted for antibiotic recommendations. Lactic acid normalized with IV fluids. Antibiotics changed to Unasyn. Infectious disease recommended transthoracic echocardiogram to rule out vegetation. She might need transesophageal echocardiogram if her TTE is negative. TTE without vegetation.  Valves appears clean and clear. Patient is being discharged on Augmentin bid for 14 days.  DKA with newly diagnosed diabetes >> Improved Patient presented with glucose of 624 with anion gap of 39. pH of 7.27, bicarb of 13. Beta hydroxybutyrate greater than 8 Likelytriggered by UTI.  Hemoglobin A1c 14.8 (uncontrolled) -replete potassium as needed - continue insulin gtt with goal of  140-180 and AG <12 - IV NS until BG <250, then  switch to D5 1/2 NS  - BMP q4hr  - keep NPO  -Anion gap closed.  Patient has been transitioned to subcu insulin. - Patient will be discharged on Lantus and Amaryl 4 mg daily.  AKI >>> Resolved. pre-renal follow with repeat BMP while getting IV fluids  Postmenopausal abnormal uterine bleeding Patient has had symptoms for several years and has never seek evaluation Concerning for endometrial neoplasm GYN consulted.  Patient underwent endoscopic ultrasound with possible biopsy. Patient is well optimized for procedure. Hgb stable. Patient has outpatient appointment with OB/GYN.  Elevated bilirubin Elevated bilirubin of 3.2. Possibly reactive due to DKA. Patient has history of cholecystectomy.  Discharge Instructions  Discharge Instructions    Call MD for:  difficulty breathing, headache or visual disturbances   Complete by: As directed    Call MD for:  persistant dizziness or light-headedness   Complete by: As directed    Call MD for:  persistant nausea and vomiting   Complete by: As directed    Call MD for:  temperature >100.4   Complete by: As directed    Diet - low sodium heart healthy   Complete by: As directed    Diet Carb Modified   Complete by: As directed    Discharge instructions   Complete by: As directed    Advised to follow-up with primary care physician in 1 week. Advised to follow-up with gynecologis as scheduled appointment has been made. Advised to take Augmentin twice a day for 14 days for the Streptococcus bacteremia.   Advised to take Lantus and NovoLog as advised.   We will request primary care physician to refer her to endocrinologist for diabetic management.   Increase activity slowly   Complete by: As directed    No wound care   Complete by: As directed      Allergies as of 11/02/2020   No Known Allergies     Medication List    TAKE these medications   amoxicillin-clavulanate 875-125 MG tablet Commonly known as: Augmentin Take 1 tablet  by mouth 2 (two) times daily for 14 days.   glimepiride 4 MG tablet Commonly known as: Amaryl Take 1 tablet (4 mg total) by mouth daily with breakfast.   insulin aspart 100 UNIT/ML FlexPen Commonly known as: NOVOLOG Inject 5 Units into the skin 3 (three) times daily with meals.   Lantus SoloStar 100 UNIT/ML Solostar Pen Generic drug: insulin glargine Inject 10 Units into the skin at bedtime.            Durable Medical Equipment  (From admission, onward)         Start     Ordered   10/31/20 1345  For home use only DME Walker rolling  Once       Question Answer Comment  Walker: With [place] Wheels   Patient needs a walker to treat with the following condition Generalized weakness      10/31/20 1345          Follow-up Information    Bubba Camp, FNP .   Specialty: Family Medicine Contact information: [address] [place] [address] [phone]        Schermerhorn, Gwen Her, MD Follow up in 1 week(s).   Specialty: Obstetrics and Gynecology Contact information: [address] [place] [address] [phone]              No Known  Allergies  Consultations:  Gyneacology  Infectious Diseases.    Procedures/Studies: CT ABDOMEN PELVIS W CONTRAST  Result Date: 10/28/2020 CLINICAL DATA:  Nausea and vomiting with weight loss for several months EXAM: CT ABDOMEN AND PELVIS WITH CONTRAST TECHNIQUE: Multidetector CT imaging of the abdomen and pelvis was performed using the standard protocol following bolus administration of intravenous contrast. CONTRAST:  36mL OMNIPAQUE IOHEXOL 300 MG/ML  SOLN COMPARISON:  None. FINDINGS: Lower chest: No acute abnormality. Hepatobiliary: Liver is well visualized and within normal limits. Gallbladder is not well seen and may have been surgically removed. Clinical correlation is recommended. Pancreas: Unremarkable. No pancreatic ductal dilatation or surrounding inflammatory changes.  Spleen: Normal in size without focal abnormality. Adrenals/Urinary Tract: Adrenal glands are within normal limits. Kidneys demonstrate a normal enhancement pattern bilaterally. Fullness of the right renal collecting system is noted secondary to extrinsic compression by the prominent uterus. Bladder is somewhat distorted secondary to the enlarged uterus but is otherwise well distended. Stomach/Bowel: Mild diverticular change of the colon is noted without evidence of diverticulitis. The appendix is within normal limits. No small bowel abnormality is seen. Small sliding-type hiatal hernia is noted. Vascular/Lymphatic: Abdominal aorta shows no aneurysmal dilatation. Prominent centrally necrotic left iliac lymph node is noted measuring 2.2 cm in short axis. No other sizable adenopathy is noted. Reproductive: The uterus is significantly enlarged measuring 8.7 by 9.9 cm in greatest AP and transverse dimensions respectively. It is elongated and diffusely decreased in attenuation with peripheral enhancement highly suggestive of endometrial neoplasm. These changes extend into the cervical canal and may extend into the vaginal vault on the sagittal images. No definitive adnexal mass is noted. Other: No free fluid is seen. No definitive endometrial deposits are noted at this time. Postsurgical changes in the anterior abdominal wall are seen. Musculoskeletal: No acute or significant osseous findings. 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 This extends into the cervical canal and likely into the vaginal vault. Associated left iliac adenopathy is noted of a similar appearance with central necrosis. Given its diffuse distribution throughout the entire uterus, this is not felt to represent fibroid change. Ultrasound is not likely to be helpful as a diagnostic tool. Direct visualization is  recommended. Tissue sampling is also recommended. Surgical consultation is also recommended. Diverticulosis without diverticulitis Prominence of the right renal collecting system and proximal right ureter secondary to extrinsic compression by the enlarged uterus. No true obstructing lesion is noted within the ureter. Electronically Signed   By: Inez Catalina M.D.   On: 10/28/2020 21:56   DG Chest Portable 1 View  Result Date: 10/28/2020 CLINICAL DATA:  Shortness of breath EXAM: PORTABLE CHEST 1 VIEW COMPARISON:  None. FINDINGS: The heart size and mediastinal contours are within normal limits. Both lungs are clear. The visualized skeletal structures are unremarkable. IMPRESSION: No active disease. Electronically Signed   By: Inez Catalina M.D.   On: 10/28/2020 19:40   ECHOCARDIOGRAM COMPLETE  Result Date: 11/02/2020    ECHOCARDIOGRAM REPORT   Patient Name:   Mindy Olson Date of Exam: 11/02/2020 Medical Rec #:  [id number]          Height:       64.0 in Accession #:    [id number]         Weight:       112.0 lb Date of Birth:  1955-12-13          BSA:  1.529 m Patient Age:    31 years           BP:           148/83 mmHg Patient Gender: F                  HR:           97 bpm. Exam Location:  [facility] Procedure: 2D Echo, Cardiac Doppler and Color Doppler Indications:     Bacteremia R78.81  History:         Patient has no prior history of Echocardiogram examinations. No                  medical history on file.  Sonographer:     Sherrie Sport RDCS (AE) Referring Phys:  [id number] Tsosie Billing Diagnosing Phys: Ida Rogue MD IMPRESSIONS  1. Left ventricular ejection fraction, by estimation, is 60 to 65%. The left ventricle has normal function. The left ventricle has no regional wall motion abnormalities. Left ventricular diastolic parameters are consistent with Grade I diastolic dysfunction (impaired relaxation).  2. Right ventricular systolic function is normal. The right ventricular size is normal.  3.  No valve vegetation noted FINDINGS  Left Ventricle: Left ventricular ejection fraction, by estimation, is 60 to 65%. The left ventricle has normal function. The left ventricle has no regional wall motion abnormalities. The left ventricular internal cavity size was normal in size. There is  no left ventricular hypertrophy. Left ventricular diastolic parameters are consistent with Grade I diastolic dysfunction (impaired relaxation). Right Ventricle: The right ventricular size is normal. No increase in right ventricular wall thickness. Right ventricular systolic function is normal. Left Atrium: Left atrial size was normal in size. Right Atrium: Right atrial size was normal in size. Pericardium: There is no evidence of pericardial effusion. Mitral Valve: The mitral valve is normal in structure. No evidence of mitral valve regurgitation. No evidence of mitral valve stenosis. Tricuspid Valve: The tricuspid valve is normal in structure. Tricuspid valve regurgitation is mild . No evidence of tricuspid stenosis. Aortic Valve: The aortic valve was not well visualized. Aortic valve regurgitation is not visualized. No aortic stenosis is present. Aortic valve mean gradient measures 3.0 mmHg. Aortic valve peak gradient measures 5.0 mmHg. Aortic valve area, by VTI measures 2.49 cm. Pulmonic Valve: The pulmonic valve was normal in structure. Pulmonic valve regurgitation is not visualized. No evidence of pulmonic stenosis. Aorta: The aortic root is normal in size and structure. Venous: The inferior vena cava is normal in size with greater than 50% respiratory variability, suggesting right atrial pressure of 3 mmHg. IAS/Shunts: No atrial level shunt detected by color flow Doppler.  LEFT VENTRICLE PLAX 2D LVIDd:         3.55 cm  Diastology LVIDs:         1.96 cm  LV e' medial:    6.96 cm/s LV PW:         1.15 cm  LV E/e' medial:  8.9 LV IVS:        0.77 cm  LV e' lateral:   4.24 cm/s LVOT diam:     2.00 cm  LV E/e' lateral: 14.6 LV  SV:         41 LV SV Index:   27 LVOT Area:     3.14 cm  RIGHT VENTRICLE RV Basal diam:  2.68 cm RV S prime:     18.40 cm/s TAPSE (M-mode): 3.4 cm LEFT ATRIUM  Index       RIGHT ATRIUM          Index LA diam:      3.00 cm 1.96 cm/m  RA Area:     7.27 cm LA Vol (A2C): 16.5 ml 10.79 ml/m RA Volume:   11.30 ml 7.39 ml/m LA Vol (A4C): 23.0 ml 15.04 ml/m  AORTIC VALVE                   PULMONIC VALVE AV Area (Vmax):    2.43 cm    PV Vmax:        0.85 m/s AV Area (Vmean):   2.18 cm    PV Peak grad:   2.9 mmHg AV Area (VTI):     2.49 cm    RVOT Peak grad: 7 mmHg AV Vmax:           112.00 cm/s AV Vmean:          81.750 cm/s AV VTI:            0.166 m AV Peak Grad:      5.0 mmHg AV Mean Grad:      3.0 mmHg LVOT Vmax:         86.80 cm/s LVOT Vmean:        56.600 cm/s LVOT VTI:          0.132 m LVOT/AV VTI ratio: 0.79  AORTA Ao Root diam: 2.80 cm MITRAL VALVE                TRICUSPID VALVE MV Area (PHT): 7.82 cm     TR Peak grad:   13.7 mmHg MV Decel Time: 97 msec      TR Vmax:        185.00 cm/s MV E velocity: 62.10 cm/s MV A velocity: 101.00 cm/s  SHUNTS MV E/A ratio:  0.61         Systemic VTI:  0.13 m                             Systemic Diam: 2.00 cm Ida Rogue MD Electronically signed by Ida Rogue MD Signature Date/Time: 11/02/2020/1:18:57 PM    Final    Echocardiogram   Subjective: Patient was seen and examined at bedside.  Overnight events noted.  Patient had echocardiogram which did not show any vegetations.  Patient feels better and want to be discharged,  cleared from Infectious Diseases.  Discharge Exam: Vitals:   11/02/20 0745 11/02/20 1126  BP: (!) 148/83 127/74  Pulse: 97 (!) 103  Resp: 16 15  Temp: 98.2 F (36.8 C) 98.2 F (36.8 C)  SpO2: 100% 96%   Vitals:   11/02/20 0000 11/02/20 0348 11/02/20 0745 11/02/20 1126  BP: 127/72 132/83 (!) 148/83 127/74  Pulse: 85 90 97 (!) 103  Resp: 18 20 16 15   Temp: 98.2 F (36.8 C) 97.8 F (36.6 C) 98.2 F (36.8 C) 98.2 F  (36.8 C)  TempSrc: Oral     SpO2: 98% 99% 100% 96%  Weight:      Height:        General: Pt is alert, awake, not in acute distress Cardiovascular: RRR, S1/S2 +, no rubs, no gallops Respiratory: CTA bilaterally, no wheezing, no rhonchi Abdominal: Soft, NT, ND, bowel sounds + Extremities: no edema, no cyanosis    The results of significant diagnostics from this hospitalization (including imaging, microbiology, ancillary and laboratory) are listed below for  reference.     Microbiology: Recent Results (from the past 240 hour(s))  SARS Coronavirus 2 by RT PCR (hospital order, performed in [facility] hospital lab) Nasopharyngeal Nasopharyngeal Swab     Status: None   Collection Time: 10/28/20  6:44 PM   Specimen: Nasopharyngeal Swab  Result Value Ref Range Status   SARS Coronavirus 2 NEGATIVE NEGATIVE Final    Comment: (NOTE) SARS-CoV-2 target nucleic acids are NOT DETECTED.  The SARS-CoV-2 RNA is generally detectable in upper and lower respiratory specimens during the acute phase of infection. The lowest concentration of SARS-CoV-2 viral copies this assay can detect is 250 copies / mL. A negative result does not preclude SARS-CoV-2 infection and should not be used as the sole basis for treatment or other patient management decisions.  A negative result may occur with improper specimen collection / handling, submission of specimen other than nasopharyngeal swab, presence of viral mutation(s) within the areas targeted by this assay, and inadequate number of viral copies (<250 copies / mL). A negative result must be combined with clinical observations, patient history, and epidemiological information.  Fact Sheet for Patients:   BoilerBrush.com.cy  Fact Sheet for Healthcare Providers: https://pope.com/  This test is not yet approved or  cleared by the [place] FDA and has been authorized for detection and/or diagnosis of  SARS-CoV-2 by FDA under an Emergency Use Authorization (EUA).  This EUA will remain in effect (meaning this test can be used) for the duration of the COVID-19 declaration under Section 564(b)(1) of the Act, 21 U.S.C. section 360bbb-3(b)(1), unless the authorization is terminated or revoked sooner.  Performed at [facility], [address] [place], [place] [address]   Blood culture (single)     Status: Abnormal   Collection Time: 10/28/20  7:26 PM   Specimen: BLOOD  Result Value Ref Range Status   Specimen Description   Final    BLOOD RIGHT ANTECUBITAL Performed at [facility], [address], [place], [place] [address]    Special Requests   Final    BOTTLES DRAWN AEROBIC AND ANAEROBIC Blood Culture adequate volume Performed at [facility], [address] [place], [place] [address]    Culture  Setup Time   Final    GRAM POSITIVE COCCI IN BOTH AEROBIC AND ANAEROBIC BOTTLES CRITICAL RESULT CALLED TO, READ BACK BY AND VERIFIED WITH: S.HALLAJI,PHARMD AT 1008 ON 10/29/20 BY GM Performed at [facility], [address] [place], [place] [address]    Culture STREPTOCOCCUS SALIVARIUS (A)  Final   Report Status 10/31/2020 FINAL  Final   Organism ID, Bacteria STREPTOCOCCUS SALIVARIUS  Final      Susceptibility   Streptococcus salivarius - MIC*    PENICILLIN 0.12 SENSITIVE Sensitive     CEFTRIAXONE <=0.12 SENSITIVE Sensitive     ERYTHROMYCIN <=0.12 SENSITIVE Sensitive     LEVOFLOXACIN 4 INTERMEDIATE Intermediate     VANCOMYCIN 1 SENSITIVE Sensitive     * STREPTOCOCCUS SALIVARIUS  Blood Culture ID Panel (Reflexed)     Status: Abnormal   Collection Time: 10/28/20  7:26 PM  Result Value Ref Range Status   Enterococcus faecalis NOT DETECTED NOT DETECTED Final   Enterococcus Faecium NOT DETECTED NOT DETECTED Final   Listeria monocytogenes NOT DETECTED NOT DETECTED Final   Staphylococcus species NOT DETECTED NOT DETECTED Final   Staphylococcus  aureus (BCID) NOT DETECTED NOT DETECTED Final   Staphylococcus epidermidis NOT DETECTED NOT DETECTED Final   Staphylococcus lugdunensis NOT DETECTED NOT  DETECTED Final   Streptococcus species DETECTED (A) NOT DETECTED Final    Comment: Not Enterococcus species, Streptococcus agalactiae, Streptococcus pyogenes, or Streptococcus pneumoniae. CRITICAL RESULT CALLED TO, READ BACK BY AND VERIFIED WITH: S.HALLAJI,PHARMD AT 1008 ON 10/29/20 BY GM    Streptococcus agalactiae NOT DETECTED NOT DETECTED Final   Streptococcus pneumoniae NOT DETECTED NOT DETECTED Final   Streptococcus pyogenes NOT DETECTED NOT DETECTED Final   A.calcoaceticus-baumannii NOT DETECTED NOT DETECTED Final   Bacteroides fragilis NOT DETECTED NOT DETECTED Final   Enterobacterales NOT DETECTED NOT DETECTED Final   Enterobacter cloacae complex NOT DETECTED NOT DETECTED Final   Escherichia coli NOT DETECTED NOT DETECTED Final   Klebsiella aerogenes NOT DETECTED NOT DETECTED Final   Klebsiella oxytoca NOT DETECTED NOT DETECTED Final   Klebsiella pneumoniae NOT DETECTED NOT DETECTED Final   Proteus species NOT DETECTED NOT DETECTED Final   Salmonella species NOT DETECTED NOT DETECTED Final   Serratia marcescens NOT DETECTED NOT DETECTED Final   Haemophilus influenzae NOT DETECTED NOT DETECTED Final   Neisseria meningitidis NOT DETECTED NOT DETECTED Final   Pseudomonas aeruginosa NOT DETECTED NOT DETECTED Final   Stenotrophomonas maltophilia NOT DETECTED NOT DETECTED Final   Candida albicans NOT DETECTED NOT DETECTED Final   Candida auris NOT DETECTED NOT DETECTED Final   Candida glabrata NOT DETECTED NOT DETECTED Final   Candida krusei NOT DETECTED NOT DETECTED Final   Candida parapsilosis NOT DETECTED NOT DETECTED Final   Candida tropicalis NOT DETECTED NOT DETECTED Final   Cryptococcus neoformans/gattii NOT DETECTED NOT DETECTED Final    Comment: Performed at [facility], [address] [place], [place] [address]   Culture, blood (single)     Status: None   Collection Time: 10/28/20  8:43 PM   Specimen: BLOOD  Result Value Ref Range Status   Specimen Description BLOOD BLOOD RIGHT WRIST  Final   Special Requests   Final    BOTTLES DRAWN AEROBIC AND ANAEROBIC Blood Culture adequate volume   Culture   Final    NO GROWTH 5 DAYS Performed at [facility], [address] [place], [place] [address]    Report Status 11/02/2020 FINAL  Final  CULTURE, BLOOD (ROUTINE X 2) w Reflex to ID Panel     Status: None (Preliminary result)   Collection Time: 10/30/20 11:43 PM   Specimen: BLOOD  Result Value Ref Range Status   Specimen Description BLOOD BLOOD RIGHT HAND  Final   Special Requests   Final    BOTTLES DRAWN AEROBIC AND ANAEROBIC Blood Culture adequate volume   Culture   Final    NO GROWTH 3 DAYS Performed at [facility], [address] [place], [place] [address]    Report Status PENDING  Incomplete  CULTURE, BLOOD (ROUTINE X 2) w Reflex to ID Panel     Status: None (Preliminary result)   Collection Time: 10/30/20 11:43 PM   Specimen: BLOOD  Result Value Ref Range Status   Specimen Description BLOOD BLOOD LEFT HAND  Final   Special Requests   Final    BOTTLES DRAWN AEROBIC AND ANAEROBIC Blood Culture adequate volume   Culture   Final    NO GROWTH 3 DAYS Performed at [facility], [address] [place], [place] [address]    Report Status PENDING  Incomplete     Labs: BNP (last 3 results) No results for input(s): BNP in the last 8760 hours. Basic Metabolic Panel: Recent Labs  Lab 10/29/20 1500 10/30/20  [id number] 10/31/20 0546 11/01/20 0444 11/02/20 0636  NA 144 143 140 140 137  K 3.7 3.8 3.4* 3.3* 3.7  CL 105 103 103 102 98  CO2 28 26 28 29 28   GLUCOSE 192* 229* 187* 249* 263*  BUN 12 11 8  5* 7*  CREATININE 0.39* 0.39* 0.34* 0.33* 0.31*  CALCIUM 8.7* 8.6* 7.8* 7.7* 7.9*  MG  --  1.9 1.5* 1.9  --   PHOS  --  1.6* 2.2* 2.4*  --    Liver Function  Tests: Recent Labs  Lab 10/28/20 1737 10/30/20 0553 11/01/20 0444  AST 16 17 17   ALT 14 12 8   ALKPHOS 217* 130* 101  BILITOT 3.2* 0.9 0.3  PROT 6.9 5.2* 4.8*  ALBUMIN 2.7* 2.0* 1.9*   No results for input(s): LIPASE, AMYLASE in the last 168 hours. No results for input(s): AMMONIA in the last 168 hours. CBC: Recent Labs  Lab 10/28/20 1737 10/29/20 1219 10/30/20 0553 10/31/20 0546 11/01/20 0444  WBC 39.8*  --  19.9* 12.0* 8.4  NEUTROABS 36.7*  --   --   --   --   HGB 12.6 11.1* 10.7* 9.6* 9.8*  HCT 40.6 35.2* 34.0* 30.6* 32.1*  MCV 81.0  --  79.4* 80.1 81.7  PLT 544*  --  311 264 271   Cardiac Enzymes: No results for input(s): CKTOTAL, CKMB, CKMBINDEX, TROPONINI in the last 168 hours. BNP: Invalid input(s): POCBNP CBG: Recent Labs  Lab 11/01/20 1240 11/01/20 1634 11/01/20 2049 11/02/20 0748 11/02/20 1127  GLUCAP 211* 288* 194* 238* 282*   D-Dimer No results for input(s): DDIMER in the last 72 hours. Hgb A1c No results for input(s): HGBA1C in the last 72 hours. Lipid Profile No results for input(s): CHOL, HDL, LDLCALC, TRIG, CHOLHDL, LDLDIRECT in the last 72 hours. Thyroid function studies No results for input(s): TSH, T4TOTAL, T3FREE, THYROIDAB in the last 72 hours.  Invalid input(s): FREET3 Anemia work up No results for input(s): VITAMINB12, FOLATE, FERRITIN, TIBC, IRON, RETICCTPCT in the last 72 hours. Urinalysis    Component Value Date/Time   COLORURINE AMBER (A) 10/28/2020 2252   APPEARANCEUR CLOUDY (A) 10/28/2020 2252   LABSPEC 1.021 10/28/2020 2252   PHURINE 5.0 10/28/2020 2252   GLUCOSEU >=500 (A) 10/28/2020 2252   HGBUR LARGE (A) 10/28/2020 2252   BILIRUBINUR NEGATIVE 10/28/2020 2252   KETONESUR 80 (A) 10/28/2020 2252   PROTEINUR 100 (A) 10/28/2020 2252   NITRITE NEGATIVE 10/28/2020 2252   LEUKOCYTESUR MODERATE (A) 10/28/2020 2252   Sepsis Labs Invalid input(s): PROCALCITONIN,  WBC,  LACTICIDVEN Microbiology Recent Results (from the past  240 hour(s))  SARS Coronavirus 2 by RT PCR (hospital order, performed in [place] hospital lab) Nasopharyngeal Nasopharyngeal Swab     Status: None   Collection Time: 10/28/20  6:44 PM   Specimen: Nasopharyngeal Swab  Result Value Ref Range Status   SARS Coronavirus 2 NEGATIVE NEGATIVE Final    Comment: (NOTE) SARS-CoV-2 target nucleic acids are NOT DETECTED.  The SARS-CoV-2 RNA is generally detectable in upper and lower respiratory specimens during the acute phase of infection. The lowest concentration of SARS-CoV-2 viral copies this assay can detect is 250 copies / mL. A negative result does not preclude SARS-CoV-2 infection and should not be used as the sole basis for treatment or other patient management decisions.  A negative result may occur with improper specimen collection / handling, submission of specimen other than nasopharyngeal swab, presence of viral mutation(s) within the areas targeted by this assay,  and inadequate number of viral copies (<250 copies / mL). A negative result must be combined with clinical observations, patient history, and epidemiological information.  Fact Sheet for Patients:   StrictlyIdeas.no  Fact Sheet for Healthcare Providers: BankingDealers.co.za  This test is not yet approved or  cleared by the [place] FDA and has been authorized for detection and/or diagnosis of SARS-CoV-2 by FDA under an Emergency Use Authorization (EUA).  This EUA will remain in effect (meaning this test can be used) for the duration of the COVID-19 declaration under Section 564(b)(1) of the Act, 21 U.S.C. section 360bbb-3(b)(1), unless the authorization is terminated or revoked sooner.  Performed at [facility], [address] [place], [place] [address]   Blood culture (single)     Status: Abnormal   Collection Time: 10/28/20  7:26 PM   Specimen: BLOOD  Result Value Ref Range Status   Specimen  Description   Final    BLOOD RIGHT ANTECUBITAL Performed at [facility], [address], [place], [place] [address]    Special Requests   Final    BOTTLES DRAWN AEROBIC AND ANAEROBIC Blood Culture adequate volume Performed at [facility], [place], [place] [address]    Culture  Setup Time   Final    GRAM POSITIVE COCCI IN BOTH AEROBIC AND ANAEROBIC BOTTLES CRITICAL RESULT CALLED TO, READ BACK BY AND VERIFIED WITH: S.HALLAJI,PHARMD AT 1008 ON 10/29/20 BY GM Performed at [facility], [place] [address] [place], [place] [address]    Culture STREPTOCOCCUS SALIVARIUS (A)  Final   Report Status 10/31/2020 FINAL  Final   Organism ID, Bacteria STREPTOCOCCUS SALIVARIUS  Final      Susceptibility   Streptococcus salivarius - MIC*    PENICILLIN 0.12 SENSITIVE Sensitive     CEFTRIAXONE <=0.12 SENSITIVE Sensitive     ERYTHROMYCIN <=0.12 SENSITIVE Sensitive     LEVOFLOXACIN 4 INTERMEDIATE Intermediate     VANCOMYCIN 1 SENSITIVE Sensitive     * STREPTOCOCCUS SALIVARIUS  Blood Culture ID Panel (Reflexed)     Status: Abnormal   Collection Time: 10/28/20  7:26 PM  Result Value Ref Range Status   Enterococcus faecalis NOT DETECTED NOT DETECTED Final   Enterococcus Faecium NOT DETECTED NOT DETECTED Final   Listeria monocytogenes NOT DETECTED NOT DETECTED Final   Staphylococcus species NOT DETECTED NOT DETECTED Final   Staphylococcus aureus (BCID) NOT DETECTED NOT DETECTED Final   Staphylococcus epidermidis NOT DETECTED NOT DETECTED Final   Staphylococcus lugdunensis NOT DETECTED NOT DETECTED Final   Streptococcus species DETECTED (A) NOT DETECTED Final    Comment: Not Enterococcus species, Streptococcus agalactiae, Streptococcus pyogenes, or Streptococcus pneumoniae. CRITICAL RESULT CALLED TO, READ BACK BY AND VERIFIED WITH: S.HALLAJI,PHARMD AT 1008 ON 10/29/20 BY GM    Streptococcus agalactiae NOT DETECTED NOT DETECTED Final   Streptococcus pneumoniae NOT  DETECTED NOT DETECTED Final   Streptococcus pyogenes NOT DETECTED NOT DETECTED Final   A.calcoaceticus-baumannii NOT DETECTED NOT DETECTED Final   Bacteroides fragilis NOT DETECTED NOT DETECTED Final   Enterobacterales NOT DETECTED NOT DETECTED Final   Enterobacter cloacae complex NOT DETECTED NOT DETECTED Final   Escherichia coli NOT DETECTED NOT DETECTED Final   Klebsiella aerogenes NOT DETECTED NOT DETECTED Final   Klebsiella oxytoca NOT DETECTED NOT DETECTED Final   Klebsiella pneumoniae NOT DETECTED NOT DETECTED Final   Proteus species NOT DETECTED NOT DETECTED Final   Salmonella species NOT DETECTED NOT DETECTED Final   Serratia marcescens NOT DETECTED  NOT DETECTED Final   Haemophilus influenzae NOT DETECTED NOT DETECTED Final   Neisseria meningitidis NOT DETECTED NOT DETECTED Final   Pseudomonas aeruginosa NOT DETECTED NOT DETECTED Final   Stenotrophomonas maltophilia NOT DETECTED NOT DETECTED Final   Candida albicans NOT DETECTED NOT DETECTED Final   Candida auris NOT DETECTED NOT DETECTED Final   Candida glabrata NOT DETECTED NOT DETECTED Final   Candida krusei NOT DETECTED NOT DETECTED Final   Candida parapsilosis NOT DETECTED NOT DETECTED Final   Candida tropicalis NOT DETECTED NOT DETECTED Final   Cryptococcus neoformans/gattii NOT DETECTED NOT DETECTED Final    Comment: Performed at [facility], [address] [place], [place] [address]  Culture, blood (single)     Status: None   Collection Time: 10/28/20  8:43 PM   Specimen: BLOOD  Result Value Ref Range Status   Specimen Description BLOOD BLOOD RIGHT WRIST  Final   Special Requests   Final    BOTTLES DRAWN AEROBIC AND ANAEROBIC Blood Culture adequate volume   Culture   Final    NO GROWTH 5 DAYS Performed at [facility], [address] [place], [place] [address]    Report Status 11/02/2020 FINAL  Final  CULTURE, BLOOD (ROUTINE X 2) w Reflex to ID Panel     Status: None (Preliminary result)    Collection Time: 10/30/20 11:43 PM   Specimen: BLOOD  Result Value Ref Range Status   Specimen Description BLOOD BLOOD RIGHT HAND  Final   Special Requests   Final    BOTTLES DRAWN AEROBIC AND ANAEROBIC Blood Culture adequate volume   Culture   Final    NO GROWTH 3 DAYS Performed at [facility], [address] [place], [place] [address]    Report Status PENDING  Incomplete  CULTURE, BLOOD (ROUTINE X 2) w Reflex to ID Panel     Status: None (Preliminary result)   Collection Time: 10/30/20 11:43 PM   Specimen: BLOOD  Result Value Ref Range Status   Specimen Description BLOOD BLOOD LEFT HAND  Final   Special Requests   Final    BOTTLES DRAWN AEROBIC AND ANAEROBIC Blood Culture adequate volume   Culture   Final    NO GROWTH 3 DAYS Performed at [facility], [address] [place], [place] [address]    Report Status PENDING  Incomplete     Time coordinating discharge: Over 30 minutes  SIGNED:   Shawna Clamp, MD  [facility] 11/02/2020, 2:32 PM Pager   If 7PM-7AM, please contact night-coverage [email or web address]

## 2020-11-04 LAB — CULTURE, BLOOD (ROUTINE X 2)
Culture: NO GROWTH
Culture: NO GROWTH
Special Requests: ADEQUATE
Special Requests: ADEQUATE

## 2020-11-07 ENCOUNTER — Inpatient Hospital Stay: Payer: Medicare Other | Attending: Obstetrics and Gynecology | Admitting: Obstetrics and Gynecology

## 2020-11-07 ENCOUNTER — Telehealth: Payer: Self-pay | Admitting: *Deleted

## 2020-11-07 ENCOUNTER — Ambulatory Visit
Admission: RE | Admit: 2020-11-07 | Discharge: 2020-11-07 | Disposition: A | Discharge status: Home / Self Care | Payer: Medicare Other | Source: Ambulatory Visit | Attending: Nurse Practitioner | Admitting: Nurse Practitioner

## 2020-11-07 ENCOUNTER — Encounter: Payer: Self-pay | Admitting: Obstetrics and Gynecology

## 2020-11-07 ENCOUNTER — Other Ambulatory Visit: Payer: Self-pay

## 2020-11-07 VITALS — BP 118/72 | HR 110 | Temp 97.2°F | Wt 121.2 lb

## 2020-11-07 DIAGNOSIS — Z7984 Long term (current) use of oral hypoglycemic drugs: Secondary | ICD-10-CM | POA: Insufficient documentation

## 2020-11-07 DIAGNOSIS — R600 Localized edema: Secondary | ICD-10-CM | POA: Insufficient documentation

## 2020-11-07 DIAGNOSIS — E1110 Type 2 diabetes mellitus with ketoacidosis without coma: Secondary | ICD-10-CM | POA: Insufficient documentation

## 2020-11-07 DIAGNOSIS — C541 Malignant neoplasm of endometrium: Secondary | ICD-10-CM

## 2020-11-07 DIAGNOSIS — R7881 Bacteremia: Secondary | ICD-10-CM | POA: Insufficient documentation

## 2020-11-07 DIAGNOSIS — E43 Unspecified severe protein-calorie malnutrition: Secondary | ICD-10-CM | POA: Insufficient documentation

## 2020-11-07 DIAGNOSIS — B955 Unspecified streptococcus as the cause of diseases classified elsewhere: Secondary | ICD-10-CM | POA: Insufficient documentation

## 2020-11-07 DIAGNOSIS — Z794 Long term (current) use of insulin: Secondary | ICD-10-CM | POA: Insufficient documentation

## 2020-11-07 MED ORDER — METRONIDAZOLE 500 MG PO TABS: 500.0000 mg | ORAL_TABLET | Freq: Two times a day (BID) | ORAL | 0 refills | Status: DC

## 2020-11-07 NOTE — Telephone Encounter (Signed)
[name] called stating that the soonest patient PCP can see her is 11/19/20 and not this week as you requested

## 2020-11-07 NOTE — Progress Notes (Signed)
[facility]  [address] [place], [name] [address] Phone: [phone]  Fax: [phone]   Clinic Day:  11/08/2020  Referring physician: Bubba Camp, FNP  Chief Complaint: Mindy Olson is a 65 y.o. 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   HPI: The patient was admitted to [facility] from 10/28/2020 - 11/02/2020 with DKA and UTI. She presented with nausea, vomiting, diarrhea, weight loss, and weakness. She described daily heavy abnormal bleeding for 1.5 years. She was started on an insulin drip and IV fluids. She received cefepime and Flagyl for sepsis secondary to UTI. Blood cultures grew streptococcus.  Echo on 11/02/2020 revealed an EF of 60-65%.  She was discharged on Augmentin 875 mg BID x 14 days.  Abdomen and pelvis CT with contrast on 10/18/2020 revealed an 8.7 x 9.9 cm enlarged uterus with peripheral enhancement and central decreased attenuation/necrosis that extended into the cervical canal and likely into the vaginal vault. There was associated left iliac adenopathy with a similar appearance with central necrosis. There was diverticulosis without diverticulitis. There was prominence of the right renal collecting system and proximal right ureter secondary to extrinsic compression by the enlarged uterus. There was no true obstructing lesion was noted within the ureter.  She underwent D&C and hysteroscopy on 10/30/2020 by Dr. Ouida Olson.  Pelvic exam revealed a firm cervix with an irregular shape with blood in the vagina.  Necrotic tissue was visualized.  Uterus was palpated at 13 weeks in size, firm and deviated to the right.  Endometrium curettage and cervical biopsy showed endometrial adenocarcinoma, endometrioid type, FIGO grade 2. The tumor cells were positive for vimentin and PR.They were negative for Napsin A, p16 (patchy  mosaic), and p53 (wild type staining).   She established care with Dr. Theora Olson on 11/07/2020.  Exam revealed an enlarged nontender globular 14-week size uterus deviated to the right.  Right adnexal mass probably represented the uterus.  There was positive parametrial involvement but not to the sidewall.  There was no palpable mass or parametrial involvement on the left.  PET scan was scheduled. dMMR was requested from pathology with genetic test reflex based on the results  Consideration for genetic testing will be given if her MMR protein expression is intact.  She was referred to medical oncology for consideration of chemotherapy.  She may be a surgical candidate versus radiation depending on her response.  Bilateral lower extremity venous duplex on 11/07/2020 revealed no evidence of DVT.  PET scan is scheduled for 11/22/2020.  Symptomatically, she feels better today than she has over the past few weeks. She has lost 25 lbs over the past 6 months. She has on and off allergies. She has had new leg swelling over the past month. She has rare abdominal cramping but Tylenol takes care of it. She does not have any UTI symptoms. Her legs are weak.  She denies fevers, sweats, headaches, changes in vision, runny nose, sore throat, cough, shortness of breath, chest pain, palpitations, nausea, vomiting, diarrhea, reflux, bone or joint symptoms, skin changes, numbness, balance or coordination problems, and bleeding of any kind.  She is not sure if she has ever been postmenopausal. She had hot flashes years ago. She never stopped having vaginal bleeding for over a year; sometimes she goes up to 6 months without having vaginal bleeding.  She has diabetes. She checks her blood sugar at home. The highest her  blood sugar has been since leaving the hospital was 265.  She is able to perform most of her ADLs independently. Her daughter cooks food for her. She uses a walker at home. She wishes that she was able to do more  housework.  She had a cholecystectomy at 72. She had a C section at 55. She had her tonsils out when she was young.  Her brother had colon cancer and died at 70. Her mother had lung cancer and died at 37; she never smoked. Two of her maternal aunts had breast cancer. Her maternal great grandmother had cancer. The patient's daughter is planning to get genetic testing this year.  The patient is considering chemotherapy.  The patient would like to avoid getting a port if possible. She is going to consider whether or not she would like treatment after her PET scan. She is hesitant because she watched her brother struggle with cancer treatment.   Past Medical History:  Diagnosis Date  . Diabetes mellitus without complication [facility])     Past Surgical History:  Procedure Laterality Date  . DILATATION & CURETTAGE/HYSTEROSCOPY WITH MYOSURE N/A 10/30/2020   Procedure: Fractional DILATATION & CURETTAGE/HYSTEROSCOPY with Biopsy;  Surgeon: Olson, Mindy Her, MD;  Location: [facility] ORS;  Service: Gynecology;  Laterality: N/A;    Family History  Problem Relation Age of Onset  . Lung cancer Mother   . Colon cancer Brother        76's  . Cancer Maternal Aunt     Social History:  reports that she has never smoked. She has never used smokeless tobacco. She reports previous alcohol use. She reports previous drug use. She has never smoked. She had radiation therapy on her feet for a rash that she had in elementary school. Her mother took Diethylstilbestrol (DES) while she was pregnant. She is retired and lives alone. Her daughter is Mindy Olson. The patient is accompanied by Mindy Olson today.  Allergies: No Known Allergies  Current Medications: Current Outpatient Medications  Medication Sig Dispense Refill  . amoxicillin-clavulanate (AUGMENTIN) 875-125 MG tablet Take 1 tablet by mouth 2 (two) times daily for 14 days. 28 tablet 0  . glimepiride (AMARYL) 4 MG tablet Take 1 tablet (4 mg total) by mouth daily with  breakfast. 60 tablet 1  . insulin aspart (NOVOLOG) 100 UNIT/ML FlexPen Inject 5 Units into the skin 3 (three) times daily with meals. 15 mL 1  . insulin glargine (LANTUS SOLOSTAR) 100 UNIT/ML Solostar Pen Inject 10 Units into the skin at bedtime. 15 mL 2   No current facility-administered medications for this visit.    Review of Systems  Constitutional: Positive for weight loss (25 lbs over the past 6 months). Negative for chills, diaphoresis, fever and malaise/fatigue.  HENT: Negative for congestion, ear discharge, ear pain, hearing loss, nosebleeds, sinus pain, sore throat and tinnitus.   Eyes: Negative for blurred vision.  Respiratory: Negative for cough, hemoptysis, sputum production and shortness of breath.   Cardiovascular: Positive for leg swelling (x 1 month). Negative for chest pain and palpitations.  Gastrointestinal: Positive for abdominal pain (cramping, rare). Negative for blood in stool, constipation, diarrhea, heartburn, melena, nausea and vomiting.  Genitourinary: Negative for dysuria, frequency, hematuria and urgency.  Musculoskeletal: Negative for back pain, joint pain, myalgias and neck pain.  Skin: Negative for itching and rash.  Neurological: Positive for weakness (generalized). Negative for dizziness, tingling, sensory change and headaches.  Endo/Heme/Allergies: Positive for environmental allergies (on and off). Does not bruise/bleed easily.  Diabetes  Psychiatric/Behavioral: Negative for depression and memory loss. The patient is not nervous/anxious and does not have insomnia.   All other systems reviewed and are negative.  Performance status (ECOG): 1-2  Vitals Blood pressure (!) 143/72, pulse (!) 102, temperature 98.7 F (37.1 C), resp. rate 20, weight 121 lb 9.3 oz (55.2 kg), SpO2 100 %.   Physical Exam Vitals and nursing note reviewed.  Constitutional:      General: She is not in acute distress.    Appearance: She is not diaphoretic.  HENT:     Head:  Normocephalic and atraumatic.     Comments: Short gray hair.    Mouth/Throat:     Mouth: Mucous membranes are dry.     Pharynx: Oropharynx is clear.  Eyes:     General: No scleral icterus.    Extraocular Movements: Extraocular movements intact.     Conjunctiva/sclera: Conjunctivae normal.     Pupils: Pupils are equal, round, and reactive to light.  Cardiovascular:     Rate and Rhythm: Normal rate and regular rhythm.     Heart sounds: Normal heart sounds. No murmur heard.   Pulmonary:     Effort: Pulmonary effort is normal. No respiratory distress.     Breath sounds: Normal breath sounds. No wheezing or rales.  Chest:     Chest wall: No tenderness.  Breasts:     Right: No axillary adenopathy or supraclavicular adenopathy.     Left: No axillary adenopathy or supraclavicular adenopathy.    Abdominal:     General: Bowel sounds are normal. There is no distension.     Palpations: Abdomen is soft. There is no mass.     Tenderness: There is no abdominal tenderness. There is no guarding or rebound.  Musculoskeletal:        General: No swelling or tenderness. Normal range of motion.     Cervical back: Normal range of motion and neck supple.     Comments: Negative Homan's sign  Lymphadenopathy:     Head:     Right side of head: No preauricular, posterior auricular or occipital adenopathy.     Left side of head: No preauricular, posterior auricular or occipital adenopathy.     Cervical: No cervical adenopathy.     Upper Body:     Right upper body: No supraclavicular or axillary adenopathy.     Left upper body: No supraclavicular or axillary adenopathy.     Lower Body: No right inguinal adenopathy. No left inguinal adenopathy.  Skin:    General: Skin is warm and dry.  Neurological:     Mental Status: She is alert and oriented to person, place, and time.  Psychiatric:        Behavior: Behavior normal.        Thought Content: Thought content normal.        Judgment: Judgment normal.      Comments: Tearful when discussing her brother's cancer.    No visits with results within 3 Day(s) from this visit.  Latest known visit with results is:  No results displayed because visit has over 200 results.      Assessment:  Mindy Olson is a 65 y.o. 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  Endometrium curettage and cervical biopsy showed endometrial adenocarcinoma, endometrioid type, FIGO grade 2. The tumor cells were positive for vimentin and PR.They were negative for Napsin A, p16 (patchy mosaic), and p53 (wild type staining).  Abdomen and pelvis CT with contrast on 10/28/2020 revealed an 8.7 x 9.9 cm enlarged uterus with peripheral enhancement and central decreased attenuation/necrosis that extended into the cervical canal and likely into the vaginal vault. There was associated left iliac adenopathy with a similar appearance with central necrosis. There was prominence of the right renal collecting system and proximal right ureter secondary to extrinsic compression by the enlarged uterus. There was no true obstructing lesion was noted within the ureter.  Bilateral lower extremity venous duplex on 11/07/2020 revealed no evidence of DVT.  The patient has received the COVID-19 vaccine. She received the booster on 09/11/2020.  Symptomatically, she has lost 25 lbs over the past 6 months.  She has rare abdominal cramping relived by Tylenol.  She has intermittent vaginal bleeding.  Plan: 1.    Clinical stage IIIC1 endometrial cancer  Review initial history and diagnosis.  Discuss abdomen and pelvis CT scan from 10/28/2018.   Images personally reviewed.   Discuss plan for PET to determine if she has metastatic disease.  Review evaluation by Dr Mindy Olson.    She is currently not a surgical candidate.  If imaging does not suggest metastatic disease, discuss consideration of 3-4 cycles of chemotherapy followed by  surgery or radiation.  Discuss carboplatin and Taxol.   Potential side effects reviewed.     Information provided.  Patient unsure if she wishes to pursue treatment.   She will make a decision after her PET scan on 11/22/2020.  Discuss genetic testing given her family history. 2.   RN: Information on [facility] testing. 3.   RTC on 11/26/2020 for MD assessment, review of PET scan, and discussion regarding direction of therapy.  I discussed the assessment and treatment plan with the patient.  The patient was provided an opportunity to ask questions and all were answered.  The patient agreed with the plan and demonstrated an understanding of the instructions.  The patient was advised to call back if the symptoms worsen or if the condition fails to improve as anticipated.  I provided 31 minutes of face-to-face time during this this encounter and > 50% was spent counseling as documented under my assessment and plan. An additional 15 minutes were spent reviewing his chart (Epic and Care Everywhere) including notes, labs, and imaging studies.    An Schnabel C. Mike Gip, MD, PhD    11/08/2020, 10:48 AM  I, Mirian Mo Tufford, am acting as Education administrator for [employer]. Mike Gip, MD, PhD.  I, Jerrica Thorman C. Mike Gip, MD, have reviewed the above documentation for accuracy and completeness, and I agree with the above.

## 2020-11-07 NOTE — Progress Notes (Signed)
Gynecologic Oncology Consult Visit   Referring Provider: Dr. Ouida Sills  Chief Complaint: Endometrial Cancer  Subjective:  Mindy Olson is a G22P1 65 y.o. female who is seen in consultation from Dr. Ouida Sills for endometrial adenocarcinoma.   Patient presented to ER on 10/28/20 for hyperglycemia/DKA and incidentally reported abnormal postmenopausal bleeding for 1.5 year. CT abdomen/pelvis showed enlarged uterus 8.7 x 9.9 cm with necrotic tissue extending into cervix concerning for endometrial cancer. Prominent centrally necrotic left iliac lymph node is noted measuring 2.2 cm in short axis.  On pelvic, cervix was firm,  irregular shape with blood in vagina.   10/30/20- D&C, Hysteroscopy w/ biopsy; necrotic tissue visualized. Uterus palpated 13 weeks in size, firm, slightly deviated to right. Cervical growth at 9:00 that was biopsied.   DIAGNOSIS:  A. ENDOMETRIUM; CURETTAGE:  - ENDOMETRIAL ADENOCARCINOMA, ENDOMETRIOID TYPE, FIGO GRADE 2.   B. CERVIX; BIOPSY:  - ENDOMETRIAL ADENOCARCINOMA, ENDOMETRIOID TYPE, FIGO GRADE 2.   The tumor cells are positive for vimentin and PR. They are negative for Napsin A, p16 (patchy mosaic), and p53 (wild type staining).    Hgb A1c 14.8 (10/28/20). Newly started on insulin. Sugars not yet stabilized. Continues to have vaginal bleeding and discharge. Approximately 25 lb weight loss over past 6 months. New leg swelling in past month.   Genetic testing: to be determined Tumor testing: MMR protein assessment requested   Problem List: Patient Active Problem List   Diagnosis Date Noted  . Protein-calorie malnutrition, severe 10/31/2020  . Sepsis secondary to UTI ([place]) 10/29/2020  . AKI (acute kidney injury) ([place]) 10/29/2020  . Abnormal uterine and vaginal bleeding, unspecified 10/29/2020  . Elevated bilirubin 10/29/2020  . DKA (diabetic ketoacidosis) ([place]) 10/28/2020    Past Medical History: . Protein-calorie malnutrition, severe 10/31/2020   . Sepsis secondary to UTI ([place]) 10/29/2020  . AKI (acute kidney injury) ([place]) 10/29/2020  . Abnormal uterine and vaginal bleeding, unspecified 10/29/2020  . Elevated bilirubin 10/29/2020  . DKA (diabetic ketoacidosis) ([place]) 10/28/2020    Past Surgical History: Past Surgical History:  Procedure Laterality Date  . DILATATION & CURETTAGE/HYSTEROSCOPY WITH MYOSURE N/A 10/30/2020   Procedure: Fractional DILATATION & CURETTAGE/HYSTEROSCOPY with Biopsy;  Surgeon: Schermerhorn, Gwen Her, MD;  Location: [facility] ORS;  Service: Gynecology;  Laterality: N/A;    Past Gynecologic History:  Menarche: age 70 Pain with menses Post menopausal bleeding   OB History:  OB History  No obstetric history on file.   Family History: No family history on file.  Social History: Social History   Socioeconomic History  . Marital status: Married    Spouse name: Not on file  . Number of children: Not on file  . Years of education: Not on file  . Highest education level: Not on file  Occupational History  . Not on file  Tobacco Use  . Smoking status: Never Smoker  . Smokeless tobacco: Never Used  Substance and Sexual Activity  . Alcohol use: Not Currently  . Drug use: Not Currently  . Sexual activity: Not on file  Other Topics Concern  . Not on file  Social History Narrative  . Not on file   Social Determinants of Health   Financial Resource Strain: Not on file  Food Insecurity: Not on file  Transportation Needs: Not on file  Physical Activity: Not on file  Stress: Not on file  Social Connections: Not on file  Intimate Partner Violence: Not on file    There is no immunization history on file for this  patient.   Allergies: No Known Allergies  Current Medications: Current Outpatient Medications  Medication Sig Dispense Refill  . amoxicillin-clavulanate (AUGMENTIN) 875-125 MG tablet Take 1 tablet by mouth 2 (two) times daily for 14 days. 28 tablet 0  . glimepiride (AMARYL) 4 MG tablet  Take 1 tablet (4 mg total) by mouth daily with breakfast. 60 tablet 1  . insulin aspart (NOVOLOG) 100 UNIT/ML FlexPen Inject 5 Units into the skin 3 (three) times daily with meals. 15 mL 1  . insulin glargine (LANTUS SOLOSTAR) 100 UNIT/ML Solostar Pen Inject 10 Units into the skin at bedtime. 15 mL 2   No current facility-administered medications for this visit.    General: negative for fevers, night sweats; positive weight loss Skin: negative for changes in moles or sores or rash Eyes: negative for changes in vision HEENT: negative for change in hearing, tinnitus, voice changes Pulmonary: negative for dyspnea, orthopnea, productive cough, wheezing Cardiac: negative for palpitations, pain Gastrointestinal: negative for nausea, vomiting, constipation, diarrhea, hematemesis, hematochezia Genitourinary/Sexual: negative for dysuria, retention, hematuria, incontinence Ob/Gyn:  negative for abnormal bleeding, or pain Musculoskeletal: negative for pain, joint pain, back pain Hematology: negative for easy bruising. Post-menopausal bleeding Neurologic/Psych: negative for headaches, seizures, paralysis, numbness; positive weakness   Objective:  Physical Examination:  BP 118/72   Pulse (!) 110   Temp (!) 97.2 F (36.2 C) (Tympanic)   Wt 121 lb 3.2 oz (55 kg)   SpO2 97%   BMI 20.80 kg/m     ECOG Performance Status: 3 - Symptomatic, >50% confined to bed  GENERAL: Patient is a well appearing female in no acute distress HEENT:  PERRL, neck supple with midline trachea. Thyroid without masses.  NODES:  No cervical, supraclavicular, axillary, or inguinal lymphadenopathy palpated.  LUNGS:  Clear to auscultation bilaterally.  No wheezes or rhonchi. HEART:  Regular rate and rhythm.  ABDOMEN:  Soft, nontender, no ascites; palpable mass right lower quadrant may represent the uterus. No hepatosplenomegaly.  MSK:  No focal spinal tenderness to palpation. Full range of motion bilaterally in the upper  extremities. EXTREMITIES:  Peripheral edema, 1+ SKIN:  Clear with no obvious rashes or skin changes. NEURO:  Nonfocal. Well oriented.  Appropriate affect.  Pelvic: Chaperoned by nursing EGBUS: no lesions Cervix: grossly positive erythematous lesion c/w malignancy, slight tenderness to palpation, limited mobile and deviated to the right.  Vagina: Disease  Involving the right vaginal sidewall approximately 3 cm from the fornix and adherent to the cervix. Positive purulent discharge. The tumor is friable.  Uterus: enlarged nontender, globular 14 week size and deviated to the right.  Adnexa: right adnexal mass probably represents the uterus. Positive parametrial involvement but not to the sidewall. No palpable mass or parametrial involvement on the left.  Rectovaginal: confirmatory  Lab Review Lab Results  Component Value Date   WBC 8.4 11/01/2020   HGB 9.8 (L) 11/01/2020   HCT 32.1 (L) 11/01/2020   MCV 81.7 11/01/2020   PLT 271 11/01/2020     Chemistry      Component Value Date/Time   NA 137 11/02/2020 0636   K 3.7 11/02/2020 0636   CL 98 11/02/2020 0636   CO2 28 11/02/2020 0636   BUN 7 (L) 11/02/2020 0636   CREATININE 0.31 (L) 11/02/2020 0636      Component Value Date/Time   CALCIUM 7.9 (L) 11/02/2020 0636   ALKPHOS 101 11/01/2020 0444   AST 17 11/01/2020 0444   ALT 8 11/01/2020 0444   BILITOT 0.3 11/01/2020  0444     Lab Results  Component Value Date   HGBA1C 14.8 (H) 10/28/2020     Radiologic Imaging: CT imaging reviewed on site today    Assessment:  Mindy Olson is a 65 y.o. female diagnosed with at least stage IIIC1, grade 2 endometrioid endometrial cancer. Given constitutional symptoms concern for more advanced disease.   Streptococcus bacteremia and possible hematometra, on antibiotic therapy with adequate coverage anaerobes.   Bilateral peripheral edema  Poorly controlled newly diagnosed diabetes  Poor performance status  Family history of  malignancy mother (lung cancer, never smoker) and brother (colon cancer in his 55's)  Medical co-morbidities complicating care:  Protein-calorie malnutrition, severe 10/31/2020  H/o Sepsis secondary to possible UTI ([place]) 10/29/2020  AKI (acute kidney injury) ([place]) 10/29/2020  DKA (diabetic ketoacidosis) ([place]) 10/28/2020        Plan:   Problem List Items Addressed This Visit   None   Visit Diagnoses    Endometrial cancer ([place])    -  Primary   Relevant Orders   NM PET Image Initial (PI) Skull Base To Thigh   US Venous Img Lower Bilateral (Completed)   Ambulatory referral to Hematology / Oncology      We will obtain Chest CT vs PET imaging to assess metastatic disease. We requested dMMR from pathology. Genetic testing can be reflexed on these results. If her MMR protein expression is intact we can discuss the role of genetic testing given her family history, specifically her brother.   Referral to medical oncology for chemotherapy. Reassess after 3 cycles for response. If she she has an amazing response we may be able to consider surgery. Alternatively radiation may be an option. Before determining the final plan we need to follow up the imaging.   Streptococcus bacteremia and possible hematometra, on antibiotic therapy with adequate coverage anaerobes, continue Augmentin. She will follow up with ID as schedule. At her next visit if she is still have purulent discharge they would like [place] to obtain a culture.    Bilateral peripheral edema- Bilateral dopplers ordered to assess for DVT.   Poorly controlled newly diagnosed diabetes - continue to optimize glycemic control. She is establishing PCP.   Poor performance status  We had a long discussion. She expressed that she may not be interested in treatment and if not we can help assess with supportive care and hospice. Although I have encouraged her to try treatment as she may have an excellent response. We will have more information to  provide regarding prognosis once we complete her tumor testing and imaging studies.    The patient's diagnosis, an outline of the further diagnostic and laboratory studies which will be required, the recommendation for surgery, and alternatives were discussed with her and her accompanying family members.  All questions were answered to their satisfaction.  A total of at least 80 minutes were spent with the patient/family today; >50% was spent in education, counseling and coordination of care for newly diagnosed advanced endometrial cancer.   Jaklyn Alen Gaetana Michaelis, MD    CC:  Boykin Nearing, MD [address] [facility] [place],  [place] [address] [phone]

## 2020-11-07 NOTE — Patient Instructions (Signed)
Uterine Cancer  Uterine cancer is an abnormal growth of cancer tissue in the womb (uterus). Uterine cancer can spread to other parts of the body. Uterine cancer usually occurs after a woman stops having menstrual periods (menopause). However, it may also occur around the time that menopause starts. The wall of the uterus has an inner layer of tissue called the endometrium and an outer layer of muscle tissue called the myometrium. The most common type of uterine cancer is endometrial cancer, and it starts in the endometrium. Cancer that starts in the myometrium (uterine sarcoma) is very rare. What are the causes? The exact cause of this condition is not known. What increases the risk? You are more likely to develop this condition if you:  Are older than 50.  Have a thicker than normal endometrium (endometrial hyperplasia).  Have never been pregnant or cannot have children.  Started menstruating when you were younger than 66 years old or are older than 69 and having menstrual periods.  Have a history of cancer, including: ? Cancer of the ovaries, or cancer of the intestines, colon, or rectum (colorectal cancer). ? A family history of uterine cancer or hereditary nonpolyposis colon cancer.  Have a long-term (chronic) condition, such as: ? Diabetes. ? High blood pressure. ? Thyroid disease. ? Gallbladder disease. ? Obesity. ? A personal history of enlarged ovaries with small cysts (polycystic ovarian syndrome).  Have been exposed to: ? Radiation. ? Cigarette smoke. ? Hormone therapy or a medicine called tamoxifen. What are the signs or symptoms? Symptoms of this condition include:  Abnormal vaginal bleeding or discharge. Bleeding may start as a watery, blood-streaked flow that gradually contains more blood. This is the most common symptom. If you experience abnormal vaginal bleeding, do not assume that it is part of menopause.  Vaginal bleeding after menopause.  Unexplained weight  loss.  Bleeding between periods.  Urination that is difficult, painful, or more frequent than usual.  A lump in the vagina.  Pain, bloating, fullness in the abdomen, or pelvic pain. You may also have pain during sex. How is this diagnosed? This condition may be diagnosed based on your medical history, symptoms, a physical exam, and a pelvic exam. During the pelvic exam, your health care provider will feel your pelvis for any growths, as well as for any enlarged lymph nodes. You may also have tests, including:  Blood and urine tests.  Imaging tests, such as X-rays, CT scans, ultrasound, or MRIs.  Hysteroscopy. This involves inserting a thin, flexible tube with a light and camera on the end through the vagina to look inside the uterus.  A Pap test to check for abnormal cells in the lower part of the uterus (cervix) and the upper vagina.  Removal of a tissue sample (biopsy) from the uterine lining. The sample will be examined in a lab to check for cancer cells.  Dilation and curettage (D&C). This procedure involves stretching the cervix and scraping the lining of the uterus to get a tissue sample to check for cancer cells. The cancer is staged to determine its severity and extent. Staging is an assessment of the size of the tumor and if, or where, the cancer has spread. The stages of uterine cancer are:  Stage I. The cancer is only in the uterus.  Stage II. The cancer has spread to the cervix.  Stage III. The cancer has spread outside the uterus, but not outside the pelvis. The cancer may have spread to the lymph nodes in  the pelvis. Lymph nodes are part of your body's disease-fighting (immune) system.  Stage IV. The cancer has spread to other parts of the body, such as the bladder, rectum, and other organs. How is this treated? This condition is often treated with surgery to remove:  The uterus, cervix, fallopian tubes, and ovaries (total hysterectomy).  The uterus and cervix  (simple hysterectomy). The type of hysterectomy you will have depends on the extent of your cancer. Lymph nodes near the uterus may also be removed. Treatment may include one or more of the following:  Chemotherapy. These are medicines to kill cancer cells or to slow their growth. Chemotherapy also kills other normal, healthy cells, including blood-forming cells in the bone marrow, hair follicles, and cells in the mouth, digestive tract, and reproductive system.  Radiation therapy. This uses high-energy rays to kill cancer cells and prevent their spread.  Chemoradiation. This treatment alternates chemotherapy with radiation treatments to enhance the effects of radiation.  Brachytherapy. This involves placing small radioactive capsules inside the body where the cancer was removed.  Hormone therapy. This includes taking medicines that lower the levels of estrogen in the body.  Immunotherapy. These medicines help your immune system fight cancer.  Targeted therapy. These medicines specifically kill cancer cells. Follow these instructions at home: Medicines  Take over-the-counter and prescription medicines only as told by your health care provider.  Ask your health care provider if the medicine prescribed to you requires you to avoid driving or using machinery. Activity  Return to your normal activities as told by your health care provider. Ask your health care provider what activities are safe for you.  Get regular exercise. Aim for 30 minutes of moderate-intensity activity 5 times a week. Examples of moderate-intensity activity include walking and yoga. Be sure to talk with your health care provider before starting any exercise routine. Lifestyle  Eat a healthy diet. A healthy diet includes a lot of fruits and vegetables, low-fat dairy products, lean meats, and fiber.  Do not use any products that contain nicotine or tobacco. These products include cigarettes, chewing tobacco, and vaping  devices, such as e-cigarettes. If you need help quitting, ask your health care provider.  Consider joining a support group to help you cope with stress. Your health care provider may be able to recommend a local or online support group. General instructions  Drink enough fluid to keep your urine pale yellow.  Do not have sex, douche, or place anything in your vagina, such as a tampon or diaphragm, until your health care provider says that it is safe.  Work with your health care provider to: ? Manage any chronic conditions you have. ? Manage any side effects of your treatment.  Keep all follow-up visits. This is important. Where to find more information  American Cancer Society: www.cancer.Congress: www.cancer.gov Contact a health care provider if:  You have pain in your pelvis or abdomen that gets worse.  You cannot urinate.  You have abnormal vaginal bleeding.  You have a fever. Get help right away if:  You develop sudden or new severe symptoms, such as: ? Heavy bleeding. ? Severe weakness. ? Pain that is severe or does not get better with medicine. Summary  Uterine cancer is an abnormal growth of cancer tissue in the uterus. The most common type of uterine cancer starts in the endometrium and is called endometrial cancer.  This condition is often treated with surgery to remove the uterus, cervix, fallopian tubes,  and ovaries (total hysterectomy) or the uterus and cervix (simple hysterectomy).  Work with your health care provider to manage any long-term (chronic) conditions you have, such as diabetes, high blood pressure, thyroid disease, or gallbladder disease.  Consider joining a support group to help you cope with stress. Your health care provider may be able to recommend a local or online support group. This information is not intended to replace advice given to you by your health care provider. Make sure you discuss any questions you have with  your health care provider. Document Revised: 05/01/2020 Document Reviewed: 05/01/2020 Elsevier Patient Education  2021 [employer].

## 2020-11-08 ENCOUNTER — Other Ambulatory Visit: Payer: Self-pay | Admitting: Hematology and Oncology

## 2020-11-08 ENCOUNTER — Telehealth: Payer: Self-pay

## 2020-11-08 ENCOUNTER — Encounter: Payer: Self-pay | Admitting: Hematology and Oncology

## 2020-11-08 ENCOUNTER — Inpatient Hospital Stay: Payer: Medicare Other | Admitting: Hematology and Oncology

## 2020-11-08 VITALS — BP 143/72 | HR 102 | Temp 98.7°F | Resp 20 | Wt 121.6 lb

## 2020-11-08 DIAGNOSIS — C541 Malignant neoplasm of endometrium: Secondary | ICD-10-CM | POA: Diagnosis not present

## 2020-11-08 DIAGNOSIS — B955 Unspecified streptococcus as the cause of diseases classified elsewhere: Secondary | ICD-10-CM | POA: Diagnosis not present

## 2020-11-08 DIAGNOSIS — Z7984 Long term (current) use of oral hypoglycemic drugs: Secondary | ICD-10-CM | POA: Diagnosis not present

## 2020-11-08 DIAGNOSIS — E1110 Type 2 diabetes mellitus with ketoacidosis without coma: Secondary | ICD-10-CM | POA: Diagnosis not present

## 2020-11-08 DIAGNOSIS — R600 Localized edema: Secondary | ICD-10-CM | POA: Diagnosis not present

## 2020-11-08 DIAGNOSIS — C55 Malignant neoplasm of uterus, part unspecified: Secondary | ICD-10-CM | POA: Insufficient documentation

## 2020-11-08 DIAGNOSIS — E43 Unspecified severe protein-calorie malnutrition: Secondary | ICD-10-CM | POA: Diagnosis not present

## 2020-11-08 DIAGNOSIS — R7881 Bacteremia: Secondary | ICD-10-CM | POA: Diagnosis not present

## 2020-11-08 DIAGNOSIS — Z794 Long term (current) use of insulin: Secondary | ICD-10-CM | POA: Diagnosis not present

## 2020-11-08 NOTE — Telephone Encounter (Signed)
Called patient. No answer and unable to leave vm.

## 2020-11-08 NOTE — Patient Instructions (Signed)
Carboplatin injection What is this medicine? CARBOPLATIN (KAR boe pla tin) is a chemotherapy drug. It targets fast dividing cells, like cancer cells, and causes these cells to die. This medicine is used to treat ovarian cancer and many other cancers. This medicine may be used for other purposes; ask your health care provider or pharmacist if you have questions. COMMON BRAND NAME(S): Paraplatin What should I tell my health care provider before I take this medicine? They need to know if you have any of these conditions:  blood disorders  hearing problems  kidney disease  recent or ongoing radiation therapy  an unusual or allergic reaction to carboplatin, cisplatin, other chemotherapy, other medicines, foods, dyes, or preservatives  pregnant or trying to get pregnant  breast-feeding How should I use this medicine? This drug is usually given as an infusion into a vein. It is administered in a hospital or clinic by a specially trained health care professional. Talk to your pediatrician regarding the use of this medicine in children. Special care may be needed. Overdosage: If you think you have taken too much of this medicine contact a poison control center or emergency room at once. NOTE: This medicine is only for you. Do not share this medicine with others. What if I miss a dose? It is important not to miss a dose. Call your doctor or health care professional if you are unable to keep an appointment. What may interact with this medicine?  medicines for seizures  medicines to increase blood counts like filgrastim, pegfilgrastim, sargramostim  some antibiotics like amikacin, gentamicin, neomycin, streptomycin, tobramycin  vaccines Talk to your doctor or health care professional before taking any of these medicines:  acetaminophen  aspirin  ibuprofen  ketoprofen  naproxen This list may not describe all possible interactions. Give your health care provider a list of all the  medicines, herbs, non-prescription drugs, or dietary supplements you use. Also tell them if you smoke, drink alcohol, or use illegal drugs. Some items may interact with your medicine. What should I watch for while using this medicine? Your condition will be monitored carefully while you are receiving this medicine. You will need important blood work done while you are taking this medicine. This drug may make you feel generally unwell. This is not uncommon, as chemotherapy can affect healthy cells as well as cancer cells. Report any side effects. Continue your course of treatment even though you feel ill unless your doctor tells you to stop. In some cases, you may be given additional medicines to help with side effects. Follow all directions for their use. Call your doctor or health care professional for advice if you get a fever, chills or sore throat, or other symptoms of a cold or flu. Do not treat yourself. This drug decreases your body's ability to fight infections. Try to avoid being around people who are sick. This medicine may increase your risk to bruise or bleed. Call your doctor or health care professional if you notice any unusual bleeding. Be careful brushing and flossing your teeth or using a toothpick because you may get an infection or bleed more easily. If you have any dental work done, tell your dentist you are receiving this medicine. Avoid taking products that contain aspirin, acetaminophen, ibuprofen, naproxen, or ketoprofen unless instructed by your doctor. These medicines may hide a fever. Do not become pregnant while taking this medicine. Women should inform their doctor if they wish to become pregnant or think they might be pregnant. There is a   potential for serious side effects to an unborn child. Talk to your health care professional or pharmacist for more information. Do not breast-feed an infant while taking this medicine. What side effects may I notice from receiving this  medicine? Side effects that you should report to your doctor or health care professional as soon as possible:  allergic reactions like skin rash, itching or hives, swelling of the face, lips, or tongue  signs of infection - fever or chills, cough, sore throat, pain or difficulty passing urine  signs of decreased platelets or bleeding - bruising, pinpoint red spots on the skin, black, tarry stools, nosebleeds  signs of decreased red blood cells - unusually weak or tired, fainting spells, lightheadedness  breathing problems  changes in hearing  changes in vision  chest pain  high blood pressure  low blood counts - This drug may decrease the number of white blood cells, red blood cells and platelets. You may be at increased risk for infections and bleeding.  nausea and vomiting  pain, swelling, redness or irritation at the injection site  pain, tingling, numbness in the hands or feet  problems with balance, talking, walking  trouble passing urine or change in the amount of urine Side effects that usually do not require medical attention (report to your doctor or health care professional if they continue or are bothersome):  hair loss  loss of appetite  metallic taste in the mouth or changes in taste This list may not describe all possible side effects. Call your doctor for medical advice about side effects. You may report side effects to FDA at 1-800-FDA-1088. Where should I keep my medicine? This drug is given in a hospital or clinic and will not be stored at home. NOTE: This sheet is a summary. It may not cover all possible information. If you have questions about this medicine, talk to your doctor, pharmacist, or health care provider.  2021 Elsevier/Gold Standard (2007-12-21 14:38:05) Paclitaxel injection What is this medicine? PACLITAXEL (PAK li TAX el) is a chemotherapy drug. It targets fast dividing cells, like cancer cells, and causes these cells to die. This  medicine is used to treat ovarian cancer, breast cancer, lung cancer, Kaposi's sarcoma, and other cancers. This medicine may be used for other purposes; ask your health care provider or pharmacist if you have questions. COMMON BRAND NAME(S): Onxol, Taxol What should I tell my health care provider before I take this medicine? They need to know if you have any of these conditions:  history of irregular heartbeat  liver disease  low blood counts, like low white cell, platelet, or red cell counts  lung or breathing disease, like asthma  tingling of the fingers or toes, or other nerve disorder  an unusual or allergic reaction to paclitaxel, alcohol, polyoxyethylated castor oil, other chemotherapy, other medicines, foods, dyes, or preservatives  pregnant or trying to get pregnant  breast-feeding How should I use this medicine? This drug is given as an infusion into a vein. It is administered in a hospital or clinic by a specially trained health care professional. Talk to your pediatrician regarding the use of this medicine in children. Special care may be needed. Overdosage: If you think you have taken too much of this medicine contact a poison control center or emergency room at once. NOTE: This medicine is only for you. Do not share this medicine with others. What if I miss a dose? It is important not to miss your dose. Call your doctor or   health care professional if you are unable to keep an appointment. What may interact with this medicine? Do not take this medicine with any of the following medications:  live virus vaccines This medicine may also interact with the following medications:  antiviral medicines for hepatitis, HIV or AIDS  certain antibiotics like erythromycin and clarithromycin  certain medicines for fungal infections like ketoconazole and itraconazole  certain medicines for seizures like carbamazepine, phenobarbital,  phenytoin  gemfibrozil  nefazodone  rifampin  St. John's wort This list may not describe all possible interactions. Give your health care provider a list of all the medicines, herbs, non-prescription drugs, or dietary supplements you use. Also tell them if you smoke, drink alcohol, or use illegal drugs. Some items may interact with your medicine. What should I watch for while using this medicine? Your condition will be monitored carefully while you are receiving this medicine. You will need important blood work done while you are taking this medicine. This medicine can cause serious allergic reactions. To reduce your risk you will need to take other medicine(s) before treatment with this medicine. If you experience allergic reactions like skin rash, itching or hives, swelling of the face, lips, or tongue, tell your doctor or health care professional right away. In some cases, you may be given additional medicines to help with side effects. Follow all directions for their use. This drug may make you feel generally unwell. This is not uncommon, as chemotherapy can affect healthy cells as well as cancer cells. Report any side effects. Continue your course of treatment even though you feel ill unless your doctor tells you to stop. Call your doctor or health care professional for advice if you get a fever, chills or sore throat, or other symptoms of a cold or flu. Do not treat yourself. This drug decreases your body's ability to fight infections. Try to avoid being around people who are sick. This medicine may increase your risk to bruise or bleed. Call your doctor or health care professional if you notice any unusual bleeding. Be careful brushing and flossing your teeth or using a toothpick because you may get an infection or bleed more easily. If you have any dental work done, tell your dentist you are receiving this medicine. Avoid taking products that contain aspirin, acetaminophen, ibuprofen,  naproxen, or ketoprofen unless instructed by your doctor. These medicines may hide a fever. Do not become pregnant while taking this medicine. Women should inform their doctor if they wish to become pregnant or think they might be pregnant. There is a potential for serious side effects to an unborn child. Talk to your health care professional or pharmacist for more information. Do not breast-feed an infant while taking this medicine. Men are advised not to father a child while receiving this medicine. This product may contain alcohol. Ask your pharmacist or healthcare provider if this medicine contains alcohol. Be sure to tell all healthcare providers you are taking this medicine. Certain medicines, like metronidazole and disulfiram, can cause an unpleasant reaction when taken with alcohol. The reaction includes flushing, headache, nausea, vomiting, sweating, and increased thirst. The reaction can last from 30 minutes to several hours. What side effects may I notice from receiving this medicine? Side effects that you should report to your doctor or health care professional as soon as possible:  allergic reactions like skin rash, itching or hives, swelling of the face, lips, or tongue  breathing problems  changes in vision  fast, irregular heartbeat  high or   low blood pressure  mouth sores  pain, tingling, numbness in the hands or feet  signs of decreased platelets or bleeding - bruising, pinpoint red spots on the skin, black, tarry stools, blood in the urine  signs of decreased red blood cells - unusually weak or tired, feeling faint or lightheaded, falls  signs of infection - fever or chills, cough, sore throat, pain or difficulty passing urine  signs and symptoms of liver injury like dark yellow or brown urine; general ill feeling or flu-like symptoms; light-colored stools; loss of appetite; nausea; right upper belly pain; unusually weak or tired; yellowing of the eyes or  skin  swelling of the ankles, feet, hands  unusually slow heartbeat Side effects that usually do not require medical attention (report to your doctor or health care professional if they continue or are bothersome):  diarrhea  hair loss  loss of appetite  muscle or joint pain  nausea, vomiting  pain, redness, or irritation at site where injected  tiredness This list may not describe all possible side effects. Call your doctor for medical advice about side effects. You may report side effects to FDA at 1-800-FDA-1088. Where should I keep my medicine? This drug is given in a hospital or clinic and will not be stored at home. NOTE: This sheet is a summary. It may not cover all possible information. If you have questions about this medicine, talk to your doctor, pharmacist, or health care provider.  2021 Elsevier/Gold Standard (2019-08-17 13:37:23)  

## 2020-11-08 NOTE — Telephone Encounter (Signed)
FMLA completed for daughter, [name]. Faxed with confirmation of receipt.

## 2020-11-08 NOTE — Telephone Encounter (Signed)
MMR requested on [id number]

## 2020-11-13 ENCOUNTER — Encounter: Payer: Self-pay | Admitting: Infectious Diseases

## 2020-11-13 ENCOUNTER — Inpatient Hospital Stay: Payer: Medicare Other | Admitting: Infectious Diseases

## 2020-11-13 ENCOUNTER — Other Ambulatory Visit: Payer: Self-pay

## 2020-11-13 ENCOUNTER — Ambulatory Visit: Payer: Medicare Other | Attending: Infectious Diseases | Admitting: Infectious Diseases

## 2020-11-13 VITALS — BP 147/87 | HR 105 | Temp 97.8°F | Resp 16 | Ht 64.0 in | Wt 124.6 lb

## 2020-11-13 DIAGNOSIS — C541 Malignant neoplasm of endometrium: Secondary | ICD-10-CM | POA: Diagnosis not present

## 2020-11-13 DIAGNOSIS — R7881 Bacteremia: Secondary | ICD-10-CM | POA: Insufficient documentation

## 2020-11-13 DIAGNOSIS — Z794 Long term (current) use of insulin: Secondary | ICD-10-CM | POA: Insufficient documentation

## 2020-11-13 DIAGNOSIS — E119 Type 2 diabetes mellitus without complications: Secondary | ICD-10-CM | POA: Insufficient documentation

## 2020-11-13 DIAGNOSIS — B955 Unspecified streptococcus as the cause of diseases classified elsewhere: Secondary | ICD-10-CM | POA: Diagnosis not present

## 2020-11-13 NOTE — Progress Notes (Signed)
NAME: Mindy Olson  DOB: 08-26-1956  MRN: [id number]  Date/Time: 11/13/2020 10:52 AM   Subjective:  Mindy Olson a 65 yr old female is here for follow up visit-daughter is here with her .  pt  was in the hospital recently 10/28/20-11/02/20  for weight loss, , difficulty keeping food down and  diagnosed with DKA,  CT abdomen showed enlarged uterus. Underwent DC on 10/30/20 - Diagnosed necrotic endometrial cancer,  also had strep salivarius bacteremia- 2 d echo N-, repeat blood culture N- after being treated with IV unasyn she was switched to PO augmentin to complete 2 weeks She saw Dr.Secord Gyn onc on 2/9 and surgery is not currently feasible- She saw Dr.Corocoran oncologist on 11/08/20 and PET scan is ordered- scheduled for 11/22/20- pt will decide whether to pursue chemo depending on PET results. If stage IV she plan not to take any treatment. Says she has seen her brother ( who died of colon ca) suffer- She is worried about her 37 yr old cat Liadan, and wants to find a home for her Her last day for antibiotic is 2/18. She has some soft loose stools intermittently- she feels better other wise- energy better, appetite better- eating a lot Says her sugar is better- takes insulin but finds checking sugar by finger stick cumbersome  Past Medical History:  Diagnosis Date  . Diabetes mellitus without complication [facility])     Past Surgical History:  Procedure Laterality Date  . DILATATION & CURETTAGE/HYSTEROSCOPY WITH MYOSURE N/A 10/30/2020   Procedure: Fractional DILATATION & CURETTAGE/HYSTEROSCOPY with Biopsy;  Surgeon: Schermerhorn, Gwen Her, MD;  Location: [facility] ORS;  Service: Gynecology;  Laterality: N/A;    Social History   Socioeconomic History  . Marital status: Married    Spouse name: Not on file  . Number of children: Not on file  . Years of education: Not on file  . Highest education level: Not on file  Occupational History  . Not on file  Tobacco Use  . Smoking status: Never Smoker   . Smokeless tobacco: Never Used  Substance and Sexual Activity  . Alcohol use: Not Currently  . Drug use: Not Currently  . Sexual activity: Not on file  Other Topics Concern  . Not on file  Social History Narrative  . Not on file   Social Determinants of Health   Financial Resource Strain: Not on file  Food Insecurity: Not on file  Transportation Needs: Not on file  Physical Activity: Not on file  Stress: Not on file  Social Connections: Not on file  Intimate Partner Violence: Not on file    Family History  Problem Relation Age of Onset  . Lung cancer Mother   . Colon cancer Brother        24's  . Cancer Maternal Aunt    No Known Allergies  ? Current Outpatient Medications  Medication Sig Dispense Refill  . amoxicillin-clavulanate (AUGMENTIN) 875-125 MG tablet Take 1 tablet by mouth 2 (two) times daily for 14 days. 28 tablet 0  . glimepiride (AMARYL) 4 MG tablet Take 1 tablet (4 mg total) by mouth daily with breakfast. 60 tablet 1  . insulin aspart (NOVOLOG) 100 UNIT/ML FlexPen Inject 5 Units into the skin 3 (three) times daily with meals. 15 mL 1  . insulin glargine (LANTUS SOLOSTAR) 100 UNIT/ML Solostar Pen Inject 10 Units into the skin at bedtime. 15 mL 2   No current facility-administered medications for this visit.     Abtx:  Anti-infectives (  From admission, onward)   None      REVIEW OF SYSTEMS:  Const: negative fever, negative chills, + weight loss Eyes: negative diplopia or visual changes, negative eye pain ENT: negative coryza, negative sore throat Resp: negative cough, hemoptysis, dyspnea Cards: negative for chest pain, palpitations, lower extremity edema GU: negative for frequency, dysuria and hematuria GI: cramps in her abdomen at times Skin: negative for rash and pruritus Heme: negative for easy bruising and gum/nose bleeding MS: generalized weakness, fatigue Neurolo:negative for headaches, dizziness, vertigo, memory problems  Psych: negative  for feelings of anxiety, depression  Endocrine: , diabetes Allergy/Immunology- negative for any medication or food allergies ?  Objective:  VITALS:  BP (!) 147/87   Pulse (!) 105   Temp 97.8 F (36.6 C)   Resp 16   Ht 5\' 4"  (1.626 m)   Wt 124 lb 9.6 oz (56.5 kg)   SpO2 98%   BMI 21.39 kg/m  PHYSICAL EXAM:  General: Alert, cooperative, no distress, appears stated age.  Head: Normocephalic, without obvious abnormality, atraumatic. Eyes: Conjunctivae clear, anicteric sclerae. Pupils are equal ENT Nares normal. No drainage or sinus tenderness. Lips, mucosa, and tongue normal. No Thrush Neck: Supple, symmetrical, no adenopathy, thyroid: non tender no carotid bruit and no JVD. Back: No CVA tenderness. Lungs: Clear to auscultation bilaterally. No Wheezing or Rhonchi. No rales. Heart: tachycardia Abdomen: did not examine as she is in a wheel chair Extremities: atraumatic, no cyanosis. No edema. No clubbing Skin: No rashes or lesions. Or bruising Lymph: Cervical, supraclavicular normal. Neurologic: Grossly non-focal Pertinent Labs Lab Results CBC    Component Value Date/Time   WBC 8.4 11/01/2020 0444   RBC 3.93 11/01/2020 0444   HGB 9.8 (L) 11/01/2020 0444   HCT 32.1 (L) 11/01/2020 0444   PLT 271 11/01/2020 0444   MCV 81.7 11/01/2020 0444   MCH 24.9 (L) 11/01/2020 0444   MCHC 30.5 11/01/2020 0444   RDW 15.4 11/01/2020 0444   LYMPHSABS 0.9 10/28/2020 1737   MONOABS 1.0 10/28/2020 1737   EOSABS 0.0 10/28/2020 1737   BASOSABS 0.0 10/28/2020 1737    CMP Latest Ref Rng & Units 11/02/2020 11/01/2020 10/31/2020  Glucose 70 - 99 mg/dL 263(H) 249(H) 187(H)  BUN 8 - 23 mg/dL 7(L) 5(L) 8  Creatinine 0.44 - 1.00 mg/dL 0.31(L) 0.33(L) 0.34(L)  Sodium 135 - 145 mmol/L 137 140 140  Potassium 3.5 - 5.1 mmol/L 3.7 3.3(L) 3.4(L)  Chloride 98 - 111 mmol/L 98 102 103  CO2 22 - 32 mmol/L 28 29 28   Calcium 8.9 - 10.3 mg/dL 7.9(L) 7.7(L) 7.8(L)  Total Protein 6.5 - 8.1 g/dL - 4.8(L) -  Total  Bilirubin 0.3 - 1.2 mg/dL - 0.3 -  Alkaline Phos 38 - 126 U/L - 101 -  AST 15 - 41 U/L - 17 -  ALT 0 - 44 U/L - 8 -     ? Impression/Recommendation ? 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 Note:  This document was prepared using Dragon voice recognition software and may include unintentional dictation errors.

## 2020-11-13 NOTE — Patient Instructions (Addendum)
You are here for follow up of the bacteremia and infection in the uterus- you will be completing Amox/clav on 11/16/20- if you have loose stools it is because of antibiotic and it will get better- consume yogurt.

## 2020-11-14 DIAGNOSIS — E1165 Type 2 diabetes mellitus with hyperglycemia: Secondary | ICD-10-CM | POA: Diagnosis not present

## 2020-11-14 DIAGNOSIS — N390 Urinary tract infection, site not specified: Secondary | ICD-10-CM | POA: Diagnosis not present

## 2020-11-14 DIAGNOSIS — A409 Streptococcal sepsis, unspecified: Secondary | ICD-10-CM | POA: Diagnosis not present

## 2020-11-14 DIAGNOSIS — N950 Postmenopausal bleeding: Secondary | ICD-10-CM | POA: Diagnosis not present

## 2020-11-14 DIAGNOSIS — Z7984 Long term (current) use of oral hypoglycemic drugs: Secondary | ICD-10-CM | POA: Diagnosis not present

## 2020-11-14 DIAGNOSIS — C541 Malignant neoplasm of endometrium: Secondary | ICD-10-CM | POA: Diagnosis not present

## 2020-11-14 DIAGNOSIS — E43 Unspecified severe protein-calorie malnutrition: Secondary | ICD-10-CM | POA: Diagnosis not present

## 2020-11-14 DIAGNOSIS — Z794 Long term (current) use of insulin: Secondary | ICD-10-CM | POA: Diagnosis not present

## 2020-11-14 DIAGNOSIS — Z9049 Acquired absence of other specified parts of digestive tract: Secondary | ICD-10-CM | POA: Diagnosis not present

## 2020-11-14 DIAGNOSIS — Z8719 Personal history of other diseases of the digestive system: Secondary | ICD-10-CM | POA: Diagnosis not present

## 2020-11-14 DIAGNOSIS — Z9181 History of falling: Secondary | ICD-10-CM | POA: Diagnosis not present

## 2020-11-19 DIAGNOSIS — C801 Malignant (primary) neoplasm, unspecified: Secondary | ICD-10-CM | POA: Diagnosis not present

## 2020-11-19 DIAGNOSIS — E119 Type 2 diabetes mellitus without complications: Secondary | ICD-10-CM | POA: Diagnosis not present

## 2020-11-22 ENCOUNTER — Ambulatory Visit
Admission: RE | Admit: 2020-11-22 | Discharge: 2020-11-22 | Disposition: A | Discharge status: Home / Self Care | Payer: Medicare Other | Source: Ambulatory Visit | Attending: Nurse Practitioner | Admitting: Nurse Practitioner

## 2020-11-22 ENCOUNTER — Other Ambulatory Visit: Payer: Self-pay

## 2020-11-22 DIAGNOSIS — R599 Enlarged lymph nodes, unspecified: Secondary | ICD-10-CM | POA: Insufficient documentation

## 2020-11-22 DIAGNOSIS — C541 Malignant neoplasm of endometrium: Secondary | ICD-10-CM | POA: Diagnosis not present

## 2020-11-22 DIAGNOSIS — R1909 Other intra-abdominal and pelvic swelling, mass and lump: Secondary | ICD-10-CM | POA: Insufficient documentation

## 2020-11-22 LAB — GLUCOSE, CAPILLARY: Glucose-Capillary: 89 mg/dL (ref 70–99)

## 2020-11-22 MED ORDER — FLUDEOXYGLUCOSE F - 18 (FDG) INJECTION
6.4000 | Freq: Once | INTRAVENOUS | Status: AC | PRN
  Administered 2020-11-22: 6.85 via INTRAVENOUS

## 2020-11-22 NOTE — Progress Notes (Signed)
[facility]  [address], [address] [place], [place] [address] Phone: [phone]  Fax: [phone]   Clinic Day:  11/26/2020  Referring physician: Mulholland, Andrea, FNP  Chief Complaint: Mindy Olson is a 64 y.o. 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  HPI: The patient was last seen in the medical oncology clinic on 11/08/2020 for new patient assessment. At that time, she had lost 25 lbs over the past 6 months.  She had rare abdominal cramping relieved by Tylenol.  She had intermittent vaginal bleeding.  She was not a surgical candidate.  We discussed staging PET scan and consideration of 3-4 cycles of chemotherapy followed by surgery or radiation.  PET scan on 11/22/2020 revealed intense FDG uptake associated with the centrally necrotic uterine tumor.  There was an enlarged, FDG avid left pelvic sidewall lymph node c/w metastatic adenopathy.  There was hazy soft tissue infiltration within the right lower quadrant of the abdomen which was FDG avid and concerning for peritoneal disease.  There were no signs of solid organ metastasis or metastatic disease to the neck or chest.  During the interim, she has been fine. She is still having abdominal cramping. She takes Tylenol during the day, which helps. The cramping is worse at night and she takes Tramadol, but it does not seem to work as well anymore. She still has some vaginal bleeding and has passed a few small clots. She is a little bit lightheaded this morning and think it might be due to her blood sugar. She has not eaten in about 4 hours. She denies shortness of breath. Her leg swelling has resolved.  The patient is unsure whether she would like to pursue chemotherapy at this time. She is leaning towards chemotherapy, but is not sure yet. She would like to avoid getting a port if possible. She would  like to attend chemotherapy class.  The patient notes that if she needs another PET scan, she will need medication for anxiety.  She cannot take Percocet or codeine. They give her flu symptoms.   Past Medical History:  Diagnosis Date  . Diabetes mellitus without complication (HCC)     Past Surgical History:  Procedure Laterality Date  . DILATATION & CURETTAGE/HYSTEROSCOPY WITH MYOSURE N/A 10/30/2020   Procedure: Fractional DILATATION & CURETTAGE/HYSTEROSCOPY with Biopsy;  Surgeon: Schermerhorn, Thomas J, MD;  Location: [facility] ORS;  Service: Gynecology;  Laterality: N/A;    Family History  Problem Relation Age of Onset  . Lung cancer Mother   . Colon cancer Brother        50's  . Cancer Maternal Aunt     Social History:  reports that she has never smoked. She has never used smokeless tobacco. She reports previous alcohol use. She reports previous drug use. She has never smoked. She had radiation therapy on her feet for a rash that she had in elementary school. Her mother took Diethylstilbestrol (DES) while she was pregnant. She is retired and lives alone. Her daughter is Ellen. The patient is accompanied by Ellen today.  Allergies: No Known Allergies  Current Medications: Current Outpatient Medications  Medication Sig Dispense Refill  . glimepiride (AMARYL) 4 MG tablet Take 1 tablet (4 mg total) by mouth daily with breakfast. 60 tablet 1  . insulin aspart (NOVOLOG) 100 UNIT/ML FlexPen Inject 5 Units into the skin 3 (three) times daily with meals. 15 mL 1  .   insulin glargine (LANTUS SOLOSTAR) 100 UNIT/ML Solostar Pen Inject 10 Units into the skin at bedtime. 15 mL 2  . traMADol (ULTRAM) 50 MG tablet Take by mouth.     No current facility-administered medications for this visit.    Review of Systems  Constitutional: Negative for chills, diaphoresis, fever, malaise/fatigue and weight loss (up 2 lbs).  HENT: Negative for congestion, ear discharge, ear pain, hearing loss, nosebleeds,  sinus pain, sore throat and tinnitus.   Eyes: Negative for blurred vision.  Respiratory: Negative for cough, hemoptysis, sputum production and shortness of breath.   Cardiovascular: Negative for chest pain, palpitations and leg swelling.  Gastrointestinal: Positive for abdominal pain (cramping, managed with Tylenol). Negative for blood in stool, constipation, diarrhea, heartburn, melena, nausea and vomiting.  Genitourinary: Negative for dysuria, frequency, hematuria and urgency.       Vaginal bleeding  Musculoskeletal: Negative for back pain, joint pain, myalgias and neck pain.  Skin: Negative for itching and rash.  Neurological: Positive for dizziness (lightheaded this morning). Negative for tingling, sensory change, weakness and headaches.  Endo/Heme/Allergies: Does not bruise/bleed easily.       Diabetes  Psychiatric/Behavioral: Negative for depression and memory loss. The patient is not nervous/anxious and does not have insomnia.   All other systems reviewed and are negative.  Performance status (ECOG): 1-2  Vitals Blood pressure (!) 145/77, pulse 100, weight 126 lb 5.2 oz (57.3 kg).   Physical Exam Vitals and nursing note reviewed.  Constitutional:      General: She is not in acute distress.    Appearance: She is not diaphoretic.  Eyes:     General: No scleral icterus.    Conjunctiva/sclera: Conjunctivae normal.  Neurological:     Mental Status: She is alert and oriented to person, place, and time.  Psychiatric:        Behavior: Behavior normal.        Thought Content: Thought content normal.        Judgment: Judgment normal.    No visits with results within 3 Day(s) from this visit.  Latest known visit with results is:  Hospital Outpatient Visit on 11/22/2020  Component Date Value Ref Range Status  . Glucose-Capillary 11/22/2020 89  70 - 99 mg/dL Final   Glucose reference range applies only to samples taken after fasting for at least 8 hours.    Assessment:  Mindy Olson is a 65 y.o. 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  Endometrium curettage and cervical biopsy showed endometrial adenocarcinoma, endometrioid type, FIGO grade 2. The tumor cells were positive for vimentin and PR.They were negative for Napsin A, p16 (patchy mosaic), and p53 (wild type staining).   Abdomen and pelvis CT with contrast on 10/28/2020 revealed an 8.7 x 9.9 cm enlarged uterus with peripheral enhancement and central decreased attenuation/necrosis that extended into the cervical canal and likely into the vaginal vault. There was associated left iliac adenopathy with a similar appearance with central necrosis. There was prominence of the right renal collecting system and proximal right ureter secondary to extrinsic compression by the enlarged uterus. There was no true obstructing lesion was noted within the ureter.  PET scan on 11/22/2020 revealed intense FDG uptake associated with the centrally necrotic uterine tumor.  There was enlarged, FDG avid left pelvic sidewall lymph node c/w metastatic adenopathy.  There was hazy soft tissue infiltration within the right lower quadrant of the abdomen which was FDG avid and concerning  for peritoneal disease.  There were no signs of solid organ metastasis or metastatic disease to the neck or chest.  Bilateral lower extremity venous duplex on 11/07/2020 revealed no evidence of DVT.  The patient has received the COVID-19 vaccine. She received the booster on 09/11/2020.  Symptomatically, she notes bdominal cramping and some vaginal bleeding.  Leg swelling has resolved.  She is unsure whether she would like to pursue chemotherapy  Plan: 1.    Clinical stage IIIC1 endometrial cancer  Abdomen and pelvis CT scan on 10/28/2018 suggested stage IIIC1 disease.   Mass extended into cervical canal and vaginal vault(cT3).     There was left iliac adenopathy (cN1).  PET scan on 11/22/2020  was personally reviewed.  Agree with radiology findings.   Patient with RLQ soft tissue infiltration concerning for peritoneal disease (cM1).  Discuss palliative carboplatin and Taxol.   Response rate 50% with median PFS 13 months  Median OS 37 months   Potential side effects of treatment reviewed.   Information provided.  Patient remains unsure if she wishes to pursue treatment. 2.   RN: assess veins for chemotherapy. 3.   Preauth carboplatin and Taxol. 4.   Chemotherapy class. 5.   RN: call patient in 1 week to determine decision about chemotherapy.  I discussed the assessment and treatment plan with the patient.  The patient was provided an opportunity to ask questions and all were answered.  The patient agreed with the plan and demonstrated an understanding of the instructions.  The patient was advised to call back if the symptoms worsen or if the condition fails to improve as anticipated.  I provided 22 minutes of face-to-face time during this this encounter and > 50% was spent counseling as documented under my assessment and plan.  An additional 10 minutes were spent reviewing her chart (Epic and Care Everywhere) including notes, labs, and imaging studies.    Melissa C. Corcoran, MD, PhD    11/26/2020, 10:04 AM  I, Emily J Tufford, am acting as scribe for Melissa C. Corcoran, MD, PhD.  I, Melissa C. Corcoran, MD, have reviewed the above documentation for accuracy and completeness, and I agree with the above.  

## 2020-11-23 ENCOUNTER — Encounter: Payer: Self-pay | Admitting: Hematology and Oncology

## 2020-11-26 ENCOUNTER — Other Ambulatory Visit: Payer: Self-pay

## 2020-11-26 ENCOUNTER — Encounter: Payer: Self-pay | Admitting: Hematology and Oncology

## 2020-11-26 ENCOUNTER — Inpatient Hospital Stay: Payer: Medicare Other | Admitting: Hematology and Oncology

## 2020-11-26 VITALS — BP 145/77 | HR 100 | Wt 126.3 lb

## 2020-11-26 DIAGNOSIS — C541 Malignant neoplasm of endometrium: Secondary | ICD-10-CM

## 2020-11-26 DIAGNOSIS — E43 Unspecified severe protein-calorie malnutrition: Secondary | ICD-10-CM | POA: Diagnosis not present

## 2020-11-26 DIAGNOSIS — R7881 Bacteremia: Secondary | ICD-10-CM | POA: Diagnosis not present

## 2020-11-26 DIAGNOSIS — R600 Localized edema: Secondary | ICD-10-CM | POA: Diagnosis not present

## 2020-11-26 DIAGNOSIS — Z794 Long term (current) use of insulin: Secondary | ICD-10-CM | POA: Diagnosis not present

## 2020-11-26 DIAGNOSIS — E1110 Type 2 diabetes mellitus with ketoacidosis without coma: Secondary | ICD-10-CM | POA: Diagnosis not present

## 2020-11-26 DIAGNOSIS — Z7984 Long term (current) use of oral hypoglycemic drugs: Secondary | ICD-10-CM | POA: Diagnosis not present

## 2020-11-26 DIAGNOSIS — B955 Unspecified streptococcus as the cause of diseases classified elsewhere: Secondary | ICD-10-CM | POA: Diagnosis not present

## 2020-11-26 NOTE — Patient Instructions (Signed)
Carboplatin injection What is this medicine? CARBOPLATIN (KAR boe pla tin) is a chemotherapy drug. It targets fast dividing cells, like cancer cells, and causes these cells to die. This medicine is used to treat ovarian cancer and many other cancers. This medicine may be used for other purposes; ask your health care provider or pharmacist if you have questions. COMMON BRAND NAME(S): Paraplatin What should I tell my health care provider before I take this medicine? They need to know if you have any of these conditions:  blood disorders  hearing problems  kidney disease  recent or ongoing radiation therapy  an unusual or allergic reaction to carboplatin, cisplatin, other chemotherapy, other medicines, foods, dyes, or preservatives  pregnant or trying to get pregnant  breast-feeding How should I use this medicine? This drug is usually given as an infusion into a vein. It is administered in a hospital or clinic by a specially trained health care professional. Talk to your pediatrician regarding the use of this medicine in children. Special care may be needed. Overdosage: If you think you have taken too much of this medicine contact a poison control center or emergency room at once. NOTE: This medicine is only for you. Do not share this medicine with others. What if I miss a dose? It is important not to miss a dose. Call your doctor or health care professional if you are unable to keep an appointment. What may interact with this medicine?  medicines for seizures  medicines to increase blood counts like filgrastim, pegfilgrastim, sargramostim  some antibiotics like amikacin, gentamicin, neomycin, streptomycin, tobramycin  vaccines Talk to your doctor or health care professional before taking any of these medicines:  acetaminophen  aspirin  ibuprofen  ketoprofen  naproxen This list may not describe all possible interactions. Give your health care provider a list of all the  medicines, herbs, non-prescription drugs, or dietary supplements you use. Also tell them if you smoke, drink alcohol, or use illegal drugs. Some items may interact with your medicine. What should I watch for while using this medicine? Your condition will be monitored carefully while you are receiving this medicine. You will need important blood work done while you are taking this medicine. This drug may make you feel generally unwell. This is not uncommon, as chemotherapy can affect healthy cells as well as cancer cells. Report any side effects. Continue your course of treatment even though you feel ill unless your doctor tells you to stop. In some cases, you may be given additional medicines to help with side effects. Follow all directions for their use. Call your doctor or health care professional for advice if you get a fever, chills or sore throat, or other symptoms of a cold or flu. Do not treat yourself. This drug decreases your body's ability to fight infections. Try to avoid being around people who are sick. This medicine may increase your risk to bruise or bleed. Call your doctor or health care professional if you notice any unusual bleeding. Be careful brushing and flossing your teeth or using a toothpick because you may get an infection or bleed more easily. If you have any dental work done, tell your dentist you are receiving this medicine. Avoid taking products that contain aspirin, acetaminophen, ibuprofen, naproxen, or ketoprofen unless instructed by your doctor. These medicines may hide a fever. Do not become pregnant while taking this medicine. Women should inform their doctor if they wish to become pregnant or think they might be pregnant. There is a   potential for serious side effects to an unborn child. Talk to your health care professional or pharmacist for more information. Do not breast-feed an infant while taking this medicine. What side effects may I notice from receiving this  medicine? Side effects that you should report to your doctor or health care professional as soon as possible:  allergic reactions like skin rash, itching or hives, swelling of the face, lips, or tongue  signs of infection - fever or chills, cough, sore throat, pain or difficulty passing urine  signs of decreased platelets or bleeding - bruising, pinpoint red spots on the skin, black, tarry stools, nosebleeds  signs of decreased red blood cells - unusually weak or tired, fainting spells, lightheadedness  breathing problems  changes in hearing  changes in vision  chest pain  high blood pressure  low blood counts - This drug may decrease the number of white blood cells, red blood cells and platelets. You may be at increased risk for infections and bleeding.  nausea and vomiting  pain, swelling, redness or irritation at the injection site  pain, tingling, numbness in the hands or feet  problems with balance, talking, walking  trouble passing urine or change in the amount of urine Side effects that usually do not require medical attention (report to your doctor or health care professional if they continue or are bothersome):  hair loss  loss of appetite  metallic taste in the mouth or changes in taste This list may not describe all possible side effects. Call your doctor for medical advice about side effects. You may report side effects to FDA at 1-800-FDA-1088. Where should I keep my medicine? This drug is given in a hospital or clinic and will not be stored at home. NOTE: This sheet is a summary. It may not cover all possible information. If you have questions about this medicine, talk to your doctor, pharmacist, or health care provider.  2021 Elsevier/Gold Standard (2007-12-21 14:38:05) Paclitaxel injection What is this medicine? PACLITAXEL (PAK li TAX el) is a chemotherapy drug. It targets fast dividing cells, like cancer cells, and causes these cells to die. This  medicine is used to treat ovarian cancer, breast cancer, lung cancer, Kaposi's sarcoma, and other cancers. This medicine may be used for other purposes; ask your health care provider or pharmacist if you have questions. COMMON BRAND NAME(S): Onxol, Taxol What should I tell my health care provider before I take this medicine? They need to know if you have any of these conditions:  history of irregular heartbeat  liver disease  low blood counts, like low white cell, platelet, or red cell counts  lung or breathing disease, like asthma  tingling of the fingers or toes, or other nerve disorder  an unusual or allergic reaction to paclitaxel, alcohol, polyoxyethylated castor oil, other chemotherapy, other medicines, foods, dyes, or preservatives  pregnant or trying to get pregnant  breast-feeding How should I use this medicine? This drug is given as an infusion into a vein. It is administered in a hospital or clinic by a specially trained health care professional. Talk to your pediatrician regarding the use of this medicine in children. Special care may be needed. Overdosage: If you think you have taken too much of this medicine contact a poison control center or emergency room at once. NOTE: This medicine is only for you. Do not share this medicine with others. What if I miss a dose? It is important not to miss your dose. Call your doctor or   health care professional if you are unable to keep an appointment. What may interact with this medicine? Do not take this medicine with any of the following medications:  live virus vaccines This medicine may also interact with the following medications:  antiviral medicines for hepatitis, HIV or AIDS  certain antibiotics like erythromycin and clarithromycin  certain medicines for fungal infections like ketoconazole and itraconazole  certain medicines for seizures like carbamazepine, phenobarbital,  phenytoin  gemfibrozil  nefazodone  rifampin  St. John's wort This list may not describe all possible interactions. Give your health care provider a list of all the medicines, herbs, non-prescription drugs, or dietary supplements you use. Also tell them if you smoke, drink alcohol, or use illegal drugs. Some items may interact with your medicine. What should I watch for while using this medicine? Your condition will be monitored carefully while you are receiving this medicine. You will need important blood work done while you are taking this medicine. This medicine can cause serious allergic reactions. To reduce your risk you will need to take other medicine(s) before treatment with this medicine. If you experience allergic reactions like skin rash, itching or hives, swelling of the face, lips, or tongue, tell your doctor or health care professional right away. In some cases, you may be given additional medicines to help with side effects. Follow all directions for their use. This drug may make you feel generally unwell. This is not uncommon, as chemotherapy can affect healthy cells as well as cancer cells. Report any side effects. Continue your course of treatment even though you feel ill unless your doctor tells you to stop. Call your doctor or health care professional for advice if you get a fever, chills or sore throat, or other symptoms of a cold or flu. Do not treat yourself. This drug decreases your body's ability to fight infections. Try to avoid being around people who are sick. This medicine may increase your risk to bruise or bleed. Call your doctor or health care professional if you notice any unusual bleeding. Be careful brushing and flossing your teeth or using a toothpick because you may get an infection or bleed more easily. If you have any dental work done, tell your dentist you are receiving this medicine. Avoid taking products that contain aspirin, acetaminophen, ibuprofen,  naproxen, or ketoprofen unless instructed by your doctor. These medicines may hide a fever. Do not become pregnant while taking this medicine. Women should inform their doctor if they wish to become pregnant or think they might be pregnant. There is a potential for serious side effects to an unborn child. Talk to your health care professional or pharmacist for more information. Do not breast-feed an infant while taking this medicine. Men are advised not to father a child while receiving this medicine. This product may contain alcohol. Ask your pharmacist or healthcare provider if this medicine contains alcohol. Be sure to tell all healthcare providers you are taking this medicine. Certain medicines, like metronidazole and disulfiram, can cause an unpleasant reaction when taken with alcohol. The reaction includes flushing, headache, nausea, vomiting, sweating, and increased thirst. The reaction can last from 30 minutes to several hours. What side effects may I notice from receiving this medicine? Side effects that you should report to your doctor or health care professional as soon as possible:  allergic reactions like skin rash, itching or hives, swelling of the face, lips, or tongue  breathing problems  changes in vision  fast, irregular heartbeat  high or   low blood pressure  mouth sores  pain, tingling, numbness in the hands or feet  signs of decreased platelets or bleeding - bruising, pinpoint red spots on the skin, black, tarry stools, blood in the urine  signs of decreased red blood cells - unusually weak or tired, feeling faint or lightheaded, falls  signs of infection - fever or chills, cough, sore throat, pain or difficulty passing urine  signs and symptoms of liver injury like dark yellow or brown urine; general ill feeling or flu-like symptoms; light-colored stools; loss of appetite; nausea; right upper belly pain; unusually weak or tired; yellowing of the eyes or  skin  swelling of the ankles, feet, hands  unusually slow heartbeat Side effects that usually do not require medical attention (report to your doctor or health care professional if they continue or are bothersome):  diarrhea  hair loss  loss of appetite  muscle or joint pain  nausea, vomiting  pain, redness, or irritation at site where injected  tiredness This list may not describe all possible side effects. Call your doctor for medical advice about side effects. You may report side effects to FDA at 1-800-FDA-1088. Where should I keep my medicine? This drug is given in a hospital or clinic and will not be stored at home. NOTE: This sheet is a summary. It may not cover all possible information. If you have questions about this medicine, talk to your doctor, pharmacist, or health care provider.  2021 Elsevier/Gold Standard (2019-08-17 13:37:23)  

## 2020-11-28 ENCOUNTER — Other Ambulatory Visit: Payer: Self-pay

## 2020-11-28 ENCOUNTER — Telehealth: Payer: Self-pay | Admitting: Hematology and Oncology

## 2020-11-28 ENCOUNTER — Inpatient Hospital Stay: Payer: Medicare Other | Attending: Hematology and Oncology | Admitting: Hospice and Palliative Medicine

## 2020-11-28 ENCOUNTER — Telehealth: Payer: Self-pay

## 2020-11-28 DIAGNOSIS — C541 Malignant neoplasm of endometrium: Secondary | ICD-10-CM

## 2020-11-28 NOTE — Progress Notes (Signed)
Multidisciplinary Oncology Council Documentation  Mindy Olson was presented by our Mindy Olson on 11/28/2020, which included representatives from:  . Palliative Care . Dietitian  . Physical/Occupational Therapist . Nurse Navigator . Genetics . Speech Therapist . Social work . Survivorship RN . Hotel manager . Research RN . Mindy Olson currently presents with history of endometrial cancer  We reviewed previous medical and familial history, history of present illness, and recent lab results along with all available histopathologic and imaging studies. The Mindy Olson considered available treatment options and made the following recommendations/referrals:  Recommend referrals for genetics, OT, and nutrition  The MOC is a meeting of clinicians from various specialty areas who evaluate and discuss patients for whom a multidisciplinary approach is being considered. Final determinations in the plan of care are those of the provider(s).   Today's extended care, comprehensive team conference, Mindy Olson was not present for the discussion and was not examined.

## 2020-11-28 NOTE — Telephone Encounter (Signed)
Called pathology for results of MLH1 promotor methylation. Results are expected 11/30/20.

## 2020-11-28 NOTE — Telephone Encounter (Signed)
Patient called to cancel her chemo education class and chemo clinic appointment. She declined to reschedule at this time and stated she would call back. Appts canceled and routing to team for follow-up.

## 2020-11-29 ENCOUNTER — Inpatient Hospital Stay: Payer: Medicare Other

## 2020-11-29 ENCOUNTER — Inpatient Hospital Stay: Payer: Medicare Other | Admitting: Nurse Practitioner

## 2020-11-30 ENCOUNTER — Telehealth: Payer: Self-pay | Admitting: Hematology and Oncology

## 2020-11-30 NOTE — Telephone Encounter (Signed)
Pt called and stated that per PT she does not need OT and requested to cancel her appt on 3/16. Appointment has been cancelled.

## 2020-12-03 ENCOUNTER — Encounter: Payer: Self-pay | Admitting: Hematology and Oncology

## 2020-12-03 LAB — SURGICAL PATHOLOGY

## 2020-12-07 NOTE — Progress Notes (Signed)
START ON PATHWAY REGIMEN - Uterine     A cycle is every 21 days:     Paclitaxel      Carboplatin   **Always confirm dose/schedule in your pharmacy ordering system**  Patient Characteristics: Endometrioid, Newly Diagnosed (Clinical Staging), Nonsurgical Candidate, Stage III-IV Histology: Endometrioid Therapeutic Status: Newly Diagnosed (Clinical Staging) AJCC M Category: cM1 AJCC 8 Stage Grouping: IVB AJCC T Category: cT3b AJCC N Category: cN1  Intent of Therapy: Non-Curative / Palliative Intent, Discussed with Patient

## 2020-12-10 DIAGNOSIS — E119 Type 2 diabetes mellitus without complications: Secondary | ICD-10-CM | POA: Diagnosis not present

## 2020-12-10 DIAGNOSIS — C801 Malignant (primary) neoplasm, unspecified: Secondary | ICD-10-CM | POA: Diagnosis not present

## 2020-12-10 DIAGNOSIS — C541 Malignant neoplasm of endometrium: Secondary | ICD-10-CM | POA: Diagnosis not present

## 2020-12-10 DIAGNOSIS — K5900 Constipation, unspecified: Secondary | ICD-10-CM | POA: Diagnosis not present

## 2020-12-12 ENCOUNTER — Ambulatory Visit: Payer: Medicare Other | Admitting: Occupational Therapy

## 2020-12-12 ENCOUNTER — Telehealth: Payer: Self-pay | Admitting: Hematology and Oncology

## 2020-12-12 NOTE — Telephone Encounter (Signed)
Re:  Treatment  I spoke with the patient and later with her daughter, [name] [phone]), today.  We re-reviewed her PET scan.  PET scan revealed FDG uptake in the uterus, a left pelvic sidewall lymph node, as well as soft tissue infiltration in the right lower quadrant worrisome for peritoneal disease.  Clinically, she may have stage IVB (T3N1M1) disease based on intraperitoneal disease.  She may, however have metastasis to the adnexa (T3a).  We discussed my conversation with Dr. Fransisca Connors regarding initiation of chemotherapy (carboplatin and Taxol).  Recommendation was for 3 cycles of chemotherapy then reimaging.  Based on her imaging and response to chemotherapy, she may be a candidate for surgery or radiation.  She also may be a candidate for a total of 6 cycles of chemotherapy then possibly surgery.  We reviewed the potential side effects of chemotherapy.  She was concerned about her tolerance of treatment based on other friends and family who have received chemotherapy.  Overall, I think she would tolerate treatment fairly well.  Side effects would be managed.  Her daughter noted that nursing felt that she needed a Port-A-Cath.  Benefits of a port were discussed.  I specifically addressed the patient's feeling that she had less than 3 months to live based on her conversation with Dr. Theora Gianotti.  If she has stage IV disease life expectancy may be less than 6 months.  If she pursues treatment, she may have a good outcome with extended survival.  We discussed her concerns about quality of life issues associated with treatment.    Her daughter inquired if she could be cured.  Some patients experience an excellent response to treatment and can do well for an extended period of time.  I am unable to predict if she can be cured.  The patient had contacted hospice and was planning on enrollment next week.  She and her daughter stated that they would contact the clinic to determine if she would like  to pursue treatment.  Multiple questions were asked and answered.   Lequita Asal, MD

## 2020-12-18 ENCOUNTER — Telehealth: Payer: Self-pay | Admitting: Hematology and Oncology

## 2020-12-18 NOTE — Telephone Encounter (Signed)
Pt's daughter called and stated that per pt she would like to cancel all appts on 4/5 (genetics/labs/nutrition). Daughter stated pt does not want these appts and does not want to reschedule.

## 2020-12-18 NOTE — Telephone Encounter (Signed)
  Please cancel per patient's request.  Mindy Olson

## 2020-12-18 NOTE — Telephone Encounter (Signed)
[facility] cancelled them today

## 2020-12-19 DIAGNOSIS — R933 Abnormal findings on diagnostic imaging of other parts of digestive tract: Secondary | ICD-10-CM | POA: Diagnosis not present

## 2020-12-19 DIAGNOSIS — R590 Localized enlarged lymph nodes: Secondary | ICD-10-CM | POA: Diagnosis not present

## 2020-12-19 DIAGNOSIS — C541 Malignant neoplasm of endometrium: Secondary | ICD-10-CM | POA: Diagnosis not present

## 2020-12-27 DIAGNOSIS — K429 Umbilical hernia without obstruction or gangrene: Secondary | ICD-10-CM | POA: Diagnosis not present

## 2021-01-01 ENCOUNTER — Other Ambulatory Visit: Payer: Medicare Other

## 2021-01-01 ENCOUNTER — Encounter: Payer: Medicare Other | Admitting: Licensed Clinical Social Worker

## 2021-04-26 DIAGNOSIS — E119 Type 2 diabetes mellitus without complications: Secondary | ICD-10-CM | POA: Diagnosis not present

## 2021-07-30 DEATH — deceased

## 2022-04-21 ENCOUNTER — Other Ambulatory Visit: Payer: Self-pay
